# Patient Record
Sex: Female | Born: 1990 | Race: Black or African American | Hispanic: No | Marital: Single | Smoking: Never smoker
Health system: Southern US, Community
[De-identification: ages and names within clinical notes are randomized; demographics above are authoritative.]

## PROBLEM LIST (undated history)

## (undated) DIAGNOSIS — I10 Essential (primary) hypertension: Secondary | ICD-10-CM

## (undated) DIAGNOSIS — Z87440 Personal history of urinary (tract) infections: Secondary | ICD-10-CM

## (undated) DIAGNOSIS — N390 Urinary tract infection, site not specified: Secondary | ICD-10-CM

## (undated) DIAGNOSIS — Z87448 Personal history of other diseases of urinary system: Secondary | ICD-10-CM

## (undated) HISTORY — DX: Personal history of other diseases of urinary system: Z87.448

## (undated) HISTORY — DX: Personal history of urinary (tract) infections: Z87.440

## (undated) HISTORY — DX: Essential (primary) hypertension: I10

## (undated) HISTORY — PX: NO PAST SURGERIES: SHX2092

---
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
## 2009-03-22 ENCOUNTER — Inpatient Hospital Stay (HOSPITAL_COMMUNITY): Admission: AD | Admit: 2009-03-22 | Discharge: 2009-03-25 | Payer: Self-pay | Admitting: Obstetrics and Gynecology

## 2009-05-02 ENCOUNTER — Inpatient Hospital Stay (HOSPITAL_COMMUNITY): Admission: RE | Admit: 2009-05-02 | Discharge: 2009-05-04 | Payer: Self-pay | Admitting: Obstetrics and Gynecology

## 2010-05-23 IMAGING — US US RENAL
1 series · 13 of 25 positions shown · non-contrast
Comparison: 12/08/2008

CLINICAL DATA: Right flank pain.  Fever.  34 weeks estimated
gestational age

RENAL/URINARY TRACT ULTRASOUND COMPLETE

[Series 1: us renal · 13 of 30 slices shown]
[im 1/30]
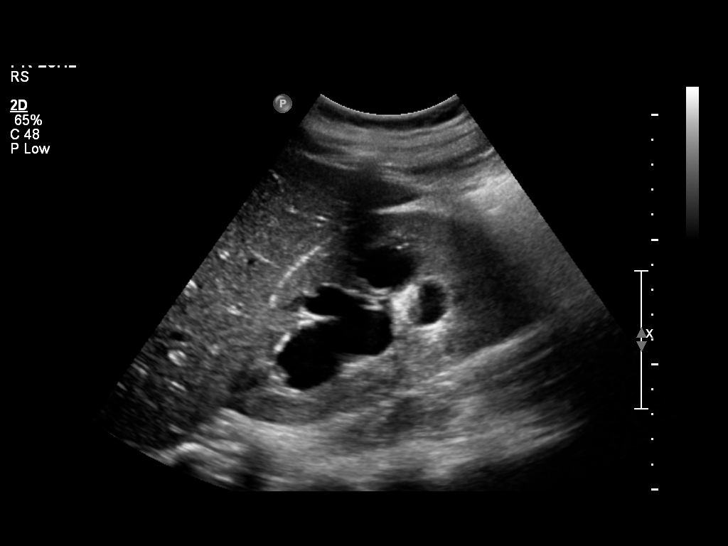
[im 3/30]
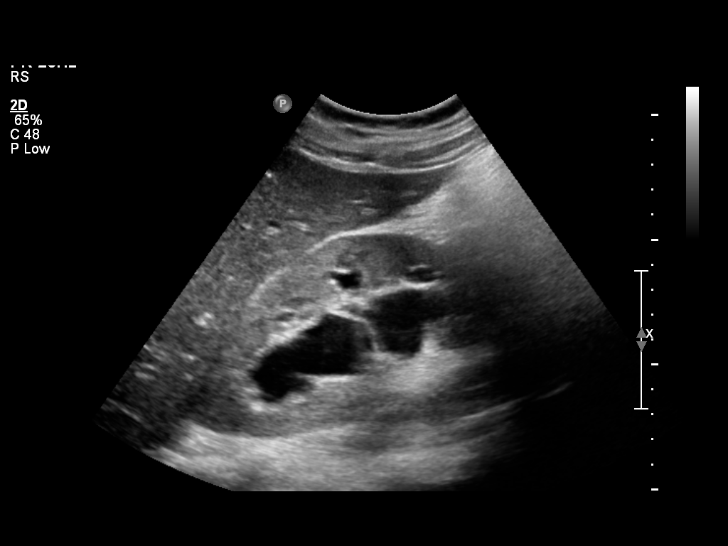
[im 5/30]
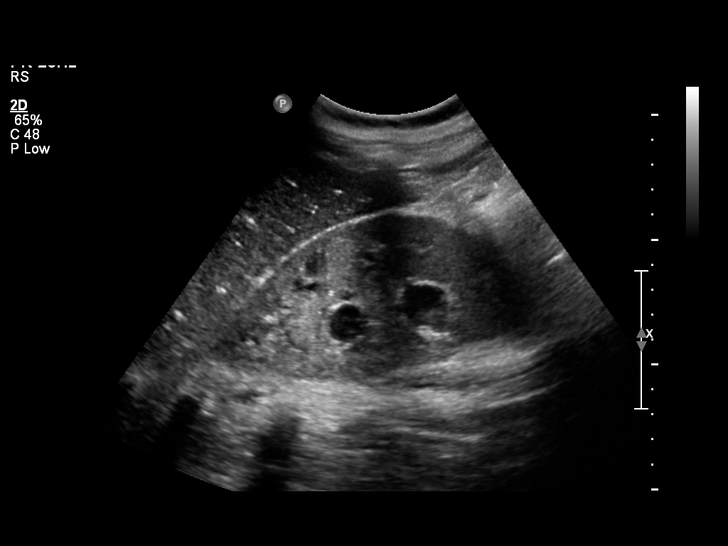
[im 8/30]
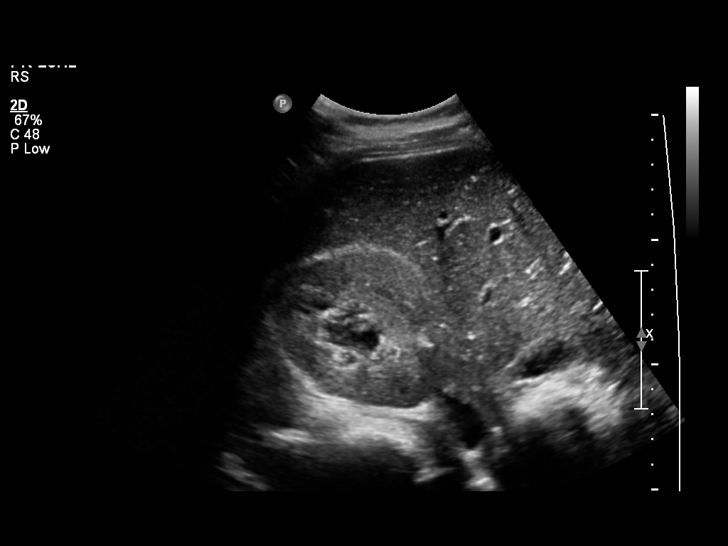
[im 10/30]
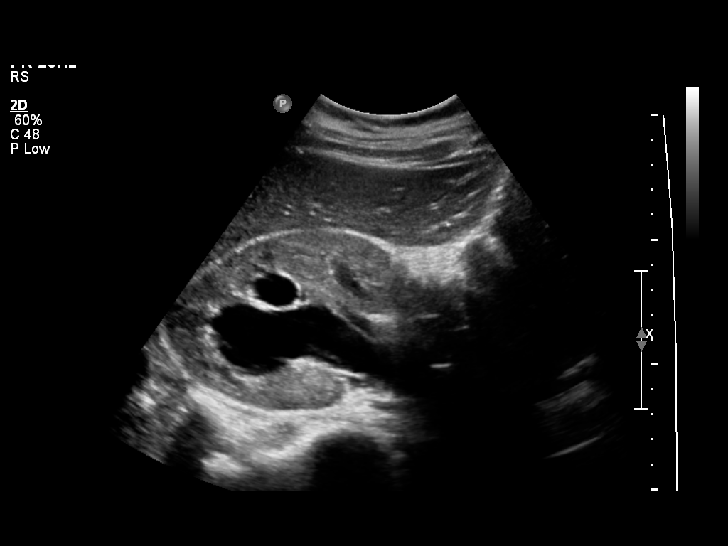
[im 13/30]
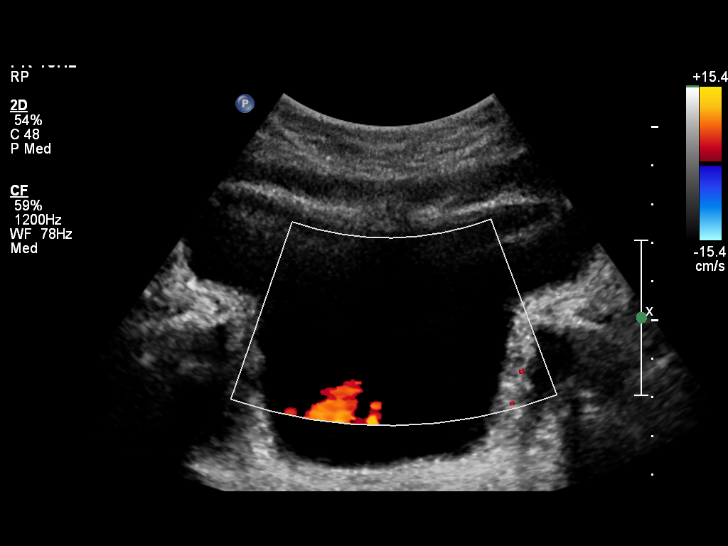
[im 15/30]
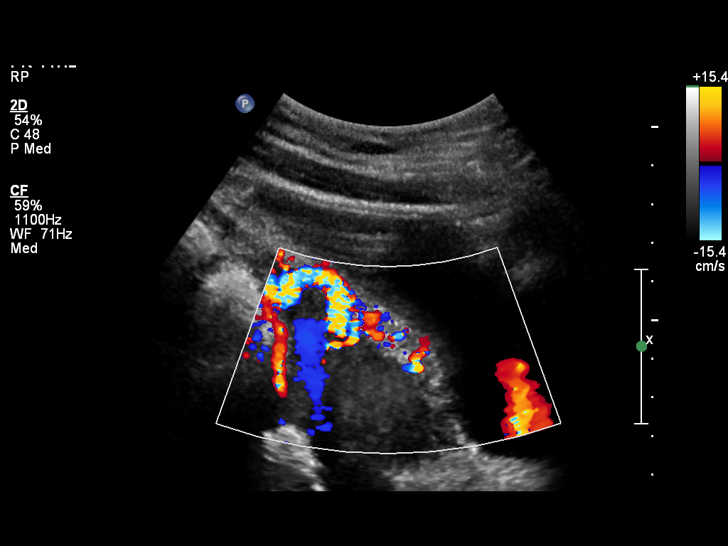
[im 17/30]
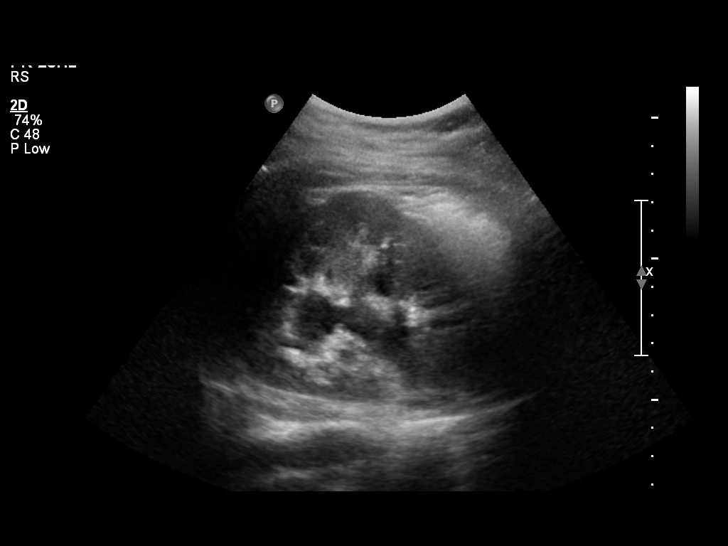
[im 20/30]
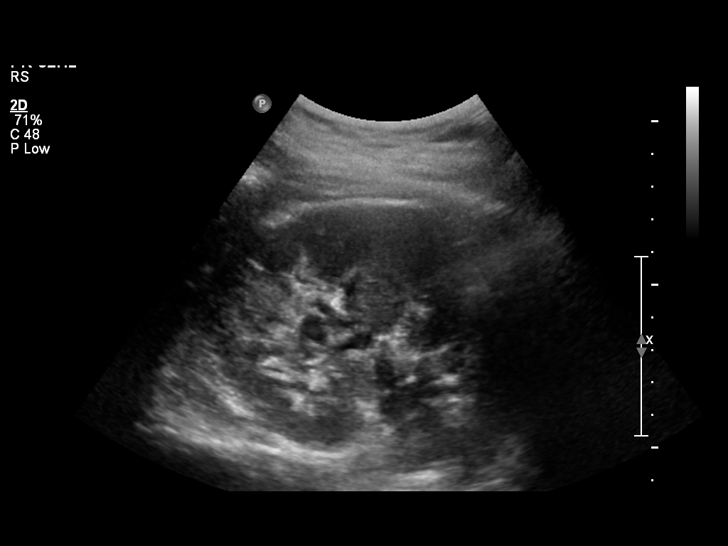
[im 22/30]
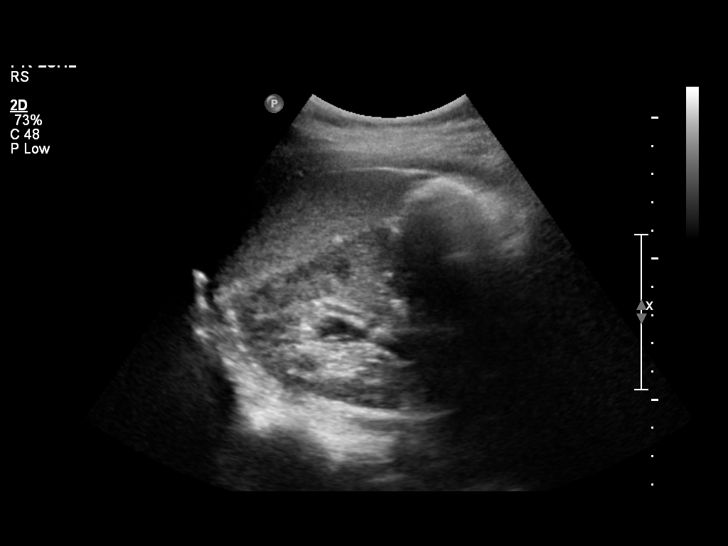
[im 25/30]
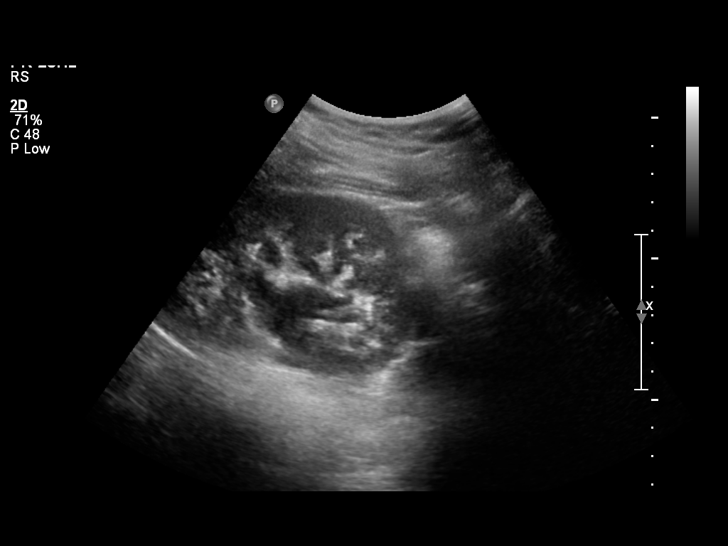
[im 27/30]
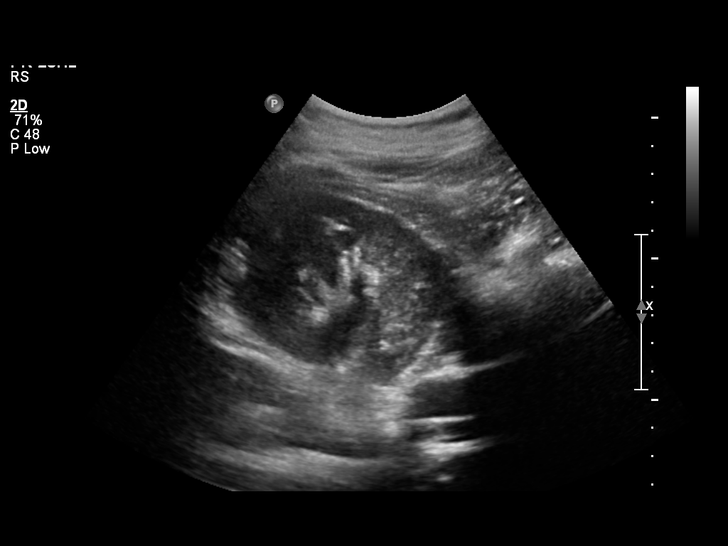
[im 30/30]
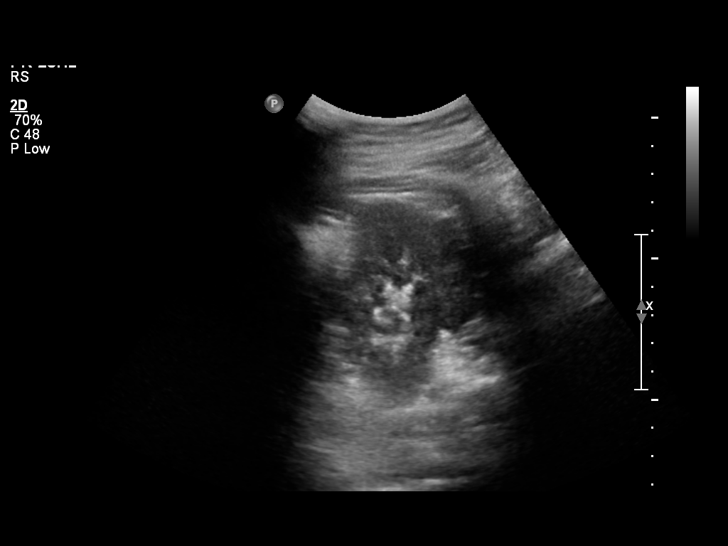

[13 of 25 positions shown; findings below may reference images not displayed]

FINDINGS: Right Kidney:  The right kidney demonstrates a sagittal length of
14.5 cm.  No focal parenchymal abnormalities are seen.  There is
moderate to severe hydronephrosis identified on the right.    This
is slightly prominent for a patient who is currently 34 weeks
estimated gestational age, but a strong right ureteral jet is
present and would mitigate against a complete obstructive process
on this side.

Left Kidney:  The left kidney demonstrates a sagittal length of
10.7 cm.  There is mild hydronephrosis identified on this side
which would correlate with the current estimated gestational age.
No focal parenchymal abnormalities are noted.

Bladder:  The bladder is well distended.  Bilateral ureteral jets
were noted.
IMPRESSION: Moderate to severe hydronephrosis on the right which is slightly
prominent for the current gestational status.  A strong ureteral
jet on this side would mitigate against an obstructive process
although a partial or intermittent obstruction cannot be excluded
with certainty. No focal parenchymal abnormalities noted.

## 2010-12-12 LAB — CBC
MCHC: 32.4 g/dL (ref 30.0–36.0)
Platelets: 120 10*3/uL — ABNORMAL LOW (ref 150–400)
RBC: 3.94 MIL/uL (ref 3.87–5.11)
RDW: 15.3 % (ref 11.5–15.5)
WBC: 16.1 10*3/uL — ABNORMAL HIGH (ref 4.0–10.5)

## 2010-12-12 LAB — RPR: RPR Ser Ql: NONREACTIVE

## 2010-12-13 LAB — COMPREHENSIVE METABOLIC PANEL
ALT: 15 U/L (ref 0–35)
AST: 18 U/L (ref 0–37)
CO2: 21 mEq/L (ref 19–32)
Chloride: 100 mEq/L (ref 96–112)
GFR calc Af Amer: >60 mL/min (ref >60)
GFR calc non Af Amer: >60 mL/min (ref >60)
Sodium: 131 mEq/L — ABNORMAL LOW (ref 135–145)
Total Bilirubin: 0.8 mg/dL (ref 0.3–1.2)

## 2010-12-13 LAB — URINALYSIS, ROUTINE W REFLEX MICROSCOPIC
Glucose, UA: NEGATIVE mg/dL
Protein, ur: 30 mg/dL — AB
Specific Gravity, Urine: <1.005 — ABNORMAL LOW (ref 1.005–1.030)

## 2010-12-13 LAB — CBC
MCV: 83 fL (ref 78.0–100.0)
Platelets: 171 10*3/uL (ref 150–400)
RBC: 3.92 MIL/uL (ref 3.87–5.11)
RDW: 14.1 % (ref 11.5–15.5)
WBC: 17.3 10*3/uL — ABNORMAL HIGH (ref 4.0–10.5)

## 2010-12-13 LAB — DIFFERENTIAL
Basophils Absolute: 0 10*3/uL (ref 0.0–0.1)
Basophils Relative: 0 % (ref 0–1)
Eosinophils Relative: 0 % (ref 0–5)
Monocytes Absolute: 1.3 10*3/uL — ABNORMAL HIGH (ref 0.1–1.0)

## 2010-12-13 LAB — URINE CULTURE

## 2010-12-13 LAB — URINE MICROSCOPIC-ADD ON

## 2010-12-16 LAB — URINE MICROSCOPIC-ADD ON

## 2010-12-16 LAB — WET PREP, GENITAL
Trich, Wet Prep: NONE SEEN
Yeast Wet Prep HPF POC: NONE SEEN

## 2010-12-16 LAB — URINE CULTURE: Colony Count: >=100000

## 2010-12-16 LAB — URINALYSIS, ROUTINE W REFLEX MICROSCOPIC
Bilirubin Urine: NEGATIVE
Nitrite: POSITIVE — AB
Specific Gravity, Urine: 1.01 (ref 1.005–1.030)
Urobilinogen, UA: 1 mg/dL (ref 0.0–1.0)

## 2010-12-16 LAB — DIFFERENTIAL
Basophils Absolute: 0 10*3/uL (ref 0.0–0.1)
Basophils Relative: 0 % (ref 0–1)
Monocytes Relative: 2 % — ABNORMAL LOW (ref 3–12)
Neutro Abs: 10.9 10*3/uL — ABNORMAL HIGH (ref 1.7–7.7)
Neutrophils Relative %: 93 % — ABNORMAL HIGH (ref 43–77)

## 2010-12-16 LAB — POCT I-STAT, CHEM 8
Chloride: 106 mEq/L (ref 96–112)
HCT: 36 % (ref 36.0–46.0)
Hemoglobin: 12.2 g/dL (ref 12.0–15.0)
Potassium: 4 mEq/L (ref 3.5–5.1)
Sodium: 137 mEq/L (ref 135–145)

## 2010-12-16 LAB — CBC
MCHC: 33.5 g/dL (ref 30.0–36.0)
Platelets: 145 10*3/uL — ABNORMAL LOW (ref 150–400)
RBC: 4.06 MIL/uL (ref 3.87–5.11)
RDW: 13.7 % (ref 11.5–15.5)

## 2011-01-19 NOTE — Discharge Summary (Signed)
Jaime Munoz, Jaime Munoz              ACCOUNT NO.:  [id number]   MEDICAL RECORD NO.:  [id number]          PATIENT TYPE:  INP   LOCATION:  9132                          FACILITY:  [facility]   PHYSICIAN:  Malachi Pro. Ambrose Mantle, M.D. DATE OF BIRTH:  [birth year]   DATE OF ADMISSION:  05/02/2009  DATE OF DISCHARGE:  05/04/2009                               DISCHARGE SUMMARY   HISTORY OF PRESENT ILLNESS:  A 20 year old African American single  female para 0, gravida 1, EDC May 09, 2009 by ultrasound at 18  weeks and 3 days and May 05, 2009 by dates of her last period  admitted for induction of labor.  Blood group and type A positive,  negative antibody, nonreactive serology, rubella immune, hepatitis B  surface antigen negative, HIV negative, GC and chlamydia negative, 1-  hour Glucola 89, group B strep negative.  The patient's early prenatal  care was complicated by pyelonephritis with hospital admission.  She was  readmitted on March 22, 2009 for 3 days for the same diagnosis.  Prenatal  care was otherwise uncomplicated.   PAST MEDICAL HISTORY:  No known allergies.  No operations.  Illnesses,  urinary tract infections.   FAMILY HISTORY:  Maternal grandmother with breast cancer.  Maternal  grandfather with prostate cancer.   SOCIAL HISTORY:  Alcohol, tobacco and drugs none.   HOSPITAL COURSE:  On admission, her vital signs were normal.  Heart and  lungs were normal.  The abdomen was soft.  Fundal height was 38 cm.  Fetal heart tones were normal.  There were good accelerations.  No  decelerations.  Cervix was a fingertip, 50% vertex at -2 to -3 on May 01, 2009.  Pitocin was begun by 12:45 p.m.  Pitocin was at 14 milliunits  a minute.  Contractions were possibly every 3 minutes, but not painful.  The cervix was 2 cm, 70-80% vertex at -2.  Artificial rupture of the  membranes produced clear fluid.  The patient then received an epidural.  By 6:17 p.m., the cervix was 6 cm, 90% vertex at -1.   She was then found  to be fully dilated with the head on the perineum.  She delivered  spontaneously OA over an intact perineum with a right labial and left  vaginal laceration.  A living female infant 6 pounds 8 ounces, Apgars of  8 at 1 and 9 at 5 minutes.  Dr. Ambrose Mantle was in attendance.  Placenta was  intact.  Uterus was normal.  Lacerations repaired with 3-0 Vicryl under  local block.  Blood loss about 400 mL.  Postpartum, the patient did  quite well and was discharged on the second postpartum day.  Initial  hemoglobin of 11.8, hematocrit 36.2, white count 12,100, platelet count  120,000.  Followup hemoglobin 10.8, RPR was nonreactive, platelet count  of 119,000.   FINAL DIAGNOSES:  Intrauterine pregnancy 39 weeks, delivered occiput  anterior.   OPERATION:  1. Spontaneous delivery OA.  2. Repair of right labial and left vaginal lacerations.   FINAL CONDITION:  Improved.   DISCHARGE INSTRUCTIONS:  Instructions include our regular discharge  instruction booklet.  The patient is advised to return to the office in  6 weeks for followup examination.  Motrin 600 mg 30 tablets one every 6  hours as needed for pain is given at discharge.      Malachi Pro. Ambrose Mantle, M.D.  Electronically Signed     TFH/MEDQ  D:  05/04/2009  T:  05/04/2009  Job:  [id number]

## 2011-01-19 NOTE — Discharge Summary (Signed)
[name], [name]              ACCOUNT NO.:  [id number]   MEDICAL RECORD NO.:  [id number]          PATIENT TYPE:  INP   LOCATION:  9317                          [facility]ACILITY:  [facility]   PHYSICIAN:  Norton Blizzard, MD    DATE OF BIRTH:  1991-08-28   DATE OF ADMISSION:  12/08/2008  DATE OF DISCHARGE:                               DISCHARGE SUMMARY   DISCHARGE DIAGNOSES:  1. Intrauterine pregnancy at 51 and 1/7th weeks gestational age.  2. Right pyelonephritis.   REASON FOR ADMISSION:  Ms. Jaime Munoz is a 20 year old gravida 1,  para 0, who presented with right flank pain, nausea, vomiting and low-  grade fever to the [facility] Emergency Room.  She was transferred to  [facility] and was admitted after noting a white count of 14 as  well as an urinalysis that was suggestive of urinary tract infection.  She was diagnosed with the right pyelonephritis and admitted for IV  antibiotics.   HOSPITAL COURSE:  The patient was admitted and received 48 hours of  intravenous Rocephin.  She had a renal ultrasound which was essentially  normal and showed bilateral ureteral jets.  OB ultrasound showed an  estimated due date of May 05, 2009, corresponding to the gestational  age at 36 weeks and 1 day at the time of discharge.  There was no  evidence of any fetal anatomic abnormalities; ultrasound risk for Down  syndrome was 1in 7151.  The patient remained afebrile during her  hospitalization.  Her pain improved.  She was tolerating full diet and  will be discharged home on a 2-week course of antibiotics.   DISCHARGE CONDITION:  Good.   DISCHARGE MEDICATIONS:  1. Keflex 500 mg 4 times daily for additional 12 more days.  2. Prenatal vitamins daily.   DISCHARGE INSTRUCTIONS:  The patient is instructed to continue her  antibiotics as prescribed.  Additionally, she was instructed that she  needs to get to the [facility] Department to begin her  prenatal care there.  She was  given a note for confirmation of her  pregnancy.      Odie Sera, DO  Electronically Signed     ______________________________  Norton Blizzard, MD    MC/MEDQ  D:  12/10/2008  T:  12/10/2008  Job:  [id number]

## 2011-01-19 NOTE — Discharge Summary (Signed)
[name], Munoz              ACCOUNT NO.:  [id number]   MEDICAL RECORD NO.:  [id number]          PATIENT TYPE:  INP   LOCATION:  9158                          [facility]ACILITY:  [facility]   PHYSICIAN:  Malachi Pro. Ambrose Mantle, M.D. DATE OF BIRTH:  08/18/1991   DATE OF ADMISSION:  03/22/2009  DATE OF DISCHARGE:  03/25/2009                               DISCHARGE SUMMARY   This is a 20 year old black female, para 0, gravida 1, estimated  gestational age [redacted] weeks, presented with complaints of decreased fetal  movement, some nausea and vomiting with blood in her emesis, fever and  chills.  The patient was seen in the office on March 19, 2009, and  diagnosed with an urinary tract infection and placed on Macrobid.  The  urine culture came back on March 21, 2009, sensitive to [facility], and the  patient was instructed to finish Macrobid and then start p.o. Keflex for  suppression due to pyelo earlier in the pregnancy.  The patient was  taking Macrobid on admission.  Prenatal care at our office since 22  weeks without problems until now.  She was admitted for pyelonephritis  before she became our patient in April 2010.   PAST MEDICAL HISTORY:  History of urinary tract infection,  pyelonephritis.   No surgical history.   No known drug allergies.   MEDICATIONS:  Macrobid.   On admission, her temperature was 101.3, otherwise vital signs were  normal.  Heart normal size and sounds, 2/6 systolic ejection murmur.  Lungs were clear to auscultation.  There was slight tenderness on the  right flank.  The abdomen was gravid and slightly tender suprapubically.  Cervix was a tight fingertip, thick, vertex was high.  Liver function  tests were normal.  Urinalysis showed only 0-2 white cells and 0-2 red  cells, white count was 17,300.  Dr. Jackelyn Knife admitted the patient with  possible pyelonephritis, placed on intravenous Ancef.  The patient  rapidly became better.  She remained somewhat febrile until the evening  of March 23, 2009, has not been febrile since that time and is ready for  discharge.  Urinalysis on admission showed 30 mg percent protein, 0-2  white cell, 0-2 red cells, rare bacteria.  Urine culture is not actually  on the chart at this point but showed 20,000 colonies of E. coli.  Comprehensive metabolic profile was normal.  White count 17,300,  hemoglobin 11, hematocrit 32.5, platelet count 157,000.  Followup white  count was 11,600, hemoglobin 10, hematocrit 30.4.  The urine culture as  stated showed 20,000 colonies per milliliter of E. coli.  Ultrasound  done to try to rule out renal obstruction showed moderate-to-severe  hydronephrosis on the right which was slightly prominent for the current  gestational age with a strong ureteral jet on that side with mitigated  gas and obstructive process, although a partial intermittent obstruction  could not be excluded with certainty.  No abnormalities of the kidneys  were seen.  The right kidney demonstrated a sagittal length of 14.5 cm.  No focal parenchymal abnormality was seen.  The left kidney measured  10.7  cm sagittal length.  There was mild hydronephrosis on that side.   FINAL DIAGNOSES:  Intrauterine pregnancy at 33 weeks, febrile morbidity  thought to be secondary to pyelonephritis, although this was not  absolutely confirmed with an urine culture of 20,000 colonies.   OPERATIONS:  None.   FINAL CONDITION:  Improved.   Instructions include regular diet, Keflex 500 mg p.o. q.6 h. for 10 days  and then 500 mg once a day.  She is to return to the office next week  and call with any problems.  Her nonstress test was reactive.  It was  reactive for 15 beats acceleration lasting 15 seconds twice in 20  minutes, although it was not super-reactive, it did meet criteria.      Malachi Pro. Ambrose Mantle, M.D.  Electronically Signed     TFH/MEDQ  D:  03/25/2009  T:  03/25/2009  Job:  [id number]

## 2011-01-22 NOTE — Discharge Summary (Signed)
Jaime Munoz, Jaime Munoz              ACCOUNT NO.:  [id number]   MEDICAL RECORD NO.:  [id number]          PATIENT TYPE:  INP   LOCATION:  9158                          [facility]ACILITY:  [facility]   PHYSICIAN:  Malachi Pro. Ambrose Mantle, M.D. DATE OF BIRTH:  [birth year]   DATE OF ADMISSION:  03/22/2009  DATE OF DISCHARGE:  03/25/2009                               DISCHARGE SUMMARY   This is a 20 year old black female, para 0, gravida 1, at 39 weeks'  gestation, presented complaining of decreased fetal movement, some  nausea and vomiting with blood in her emesis, fever and chills.  She had  been seen in the office on March 19, 2009, and diagnosed with an urinary  tract infection and placed on Macrobid.  Urine culture came back on July  16 ,2010, sensitive to [facility], and the patient was instructed to  finish Macrobid and then start p.o. Keflex for suppression due to  pyelonephritis earlier in pregnancy.  The patient began her prenatal  care in our office at 22 weeks' gestation.  She had been admitted in  April 2010 for pyelonephritis.   Past medical history of urinary tract infection.   No surgical history.   Taking Macrobid.   No known drug allergies.   On admission, temperature was 101.3.  Otherwise, her vital signs were  normal.  Heart and lungs were normal, 2/6 systolic ejection murmur.  The  abdomen was soft, gravid, slightly tender suprapubically.  Cervix was a  tight fingertip, thick.  Vertex was high.   Liver function tests were normal.  CBC showed a white count of 17,300.  Metabolic profile was normal.  The patient was begun on intravenous  Ancef.  On March 23, 2009, the patient's temperature maximum had been  101.5 at 5 a.m. on March 23, 2009.  On March 24, 2009, her temp max in the  last 24 hours had been 100.9.  She felt much better.  Admitting  urinalysis had only shown 0-2 white cells.  Urine culture showed 20,000  of E. coli.  On March 25, 2009, the patient had been afebrile for 36  hours,  she felt fine, nonstress tests did meet criteria for reactive  test, and she was ready for discharge.   LABORATORY DATA:  A renal ultrasound done to try to rule out ureteral  obstruction showed moderate-to-severe hydronephrosis on the right side  which was slightly prominent for her gestational age, but a strong  ureteral jet was seen on that side which would mitigate against an  obstructive process.  No kidney abnormalities were noted.  Her urine  culture showed 20,000 colonies per mL of E. coli sensitive to cefazolin.  Urinalysis had shown 1+ protein but 0-2 white cell, 0-2 red cells, rare  bacteria.  Metabolic profile was normal except for sodium of 131,  potassium of 3.4.  Initial hemoglobin 11.0, hematocrit 32.5, white count  17,300, platelet count of 157,000.  Followup hemoglobin 10.0, white  count 11,600.  There were 84 segs, 8 lymphs, 8 monos.   FINAL DIAGNOSES:  1. Intrauterine pregnancy at 33 weeks.  2. Urinary tract infection.  OPERATIONS:  None.   DISCHARGE MEDICATIONS:  Stop her Macrobid, take prenatal vitamins and  iron, Tylenol as needed for pain, cephalexin 500 mg by mouth every 6  hours for 10 days and subsequently 1 by mouth every day.  The patient is  to return to the office in 1 week for followup examination.      Malachi Pro. Ambrose Mantle, M.D.  Electronically Signed     TFH/MEDQ  D:  04/08/2009  T:  04/08/2009  Job:  [id number]

## 2011-06-22 ENCOUNTER — Inpatient Hospital Stay (INDEPENDENT_AMBULATORY_CARE_PROVIDER_SITE_OTHER)
Admission: RE | Admit: 2011-06-22 | Discharge: 2011-06-22 | Disposition: A | Discharge status: Home / Self Care | Payer: Self-pay | Source: Ambulatory Visit | Attending: Emergency Medicine | Admitting: Emergency Medicine

## 2011-06-22 DIAGNOSIS — L299 Pruritus, unspecified: Secondary | ICD-10-CM

## 2012-06-23 ENCOUNTER — Encounter (HOSPITAL_COMMUNITY): Payer: Self-pay | Admitting: *Deleted

## 2012-06-23 ENCOUNTER — Emergency Department (INDEPENDENT_AMBULATORY_CARE_PROVIDER_SITE_OTHER)
Admission: EM | Admit: 2012-06-23 | Discharge: 2012-06-23 | Discharge status: Against Medical Advice | Payer: Self-pay | Source: Home / Self Care

## 2012-06-23 DIAGNOSIS — N898 Other specified noninflammatory disorders of vagina: Secondary | ICD-10-CM

## 2012-06-23 HISTORY — DX: Urinary tract infection, site not specified: N39.0

## 2012-06-23 LAB — POCT URINALYSIS DIP (DEVICE)
Hgb urine dipstick: NEGATIVE
Ketones, ur: NEGATIVE mg/dL
Protein, ur: NEGATIVE mg/dL
Specific Gravity, Urine: 1.01 (ref 1.005–1.030)
Urobilinogen, UA: 0.2 mg/dL (ref 0.0–1.0)

## 2012-06-23 NOTE — ED Notes (Signed)
Patient is no longer in room, nor in WR.

## 2012-06-23 NOTE — ED Notes (Signed)
RN noticed that pt's door was open and went in exam room to check on pt and pt was gone.  Pt told no one that she was leaving.  I called her and asked her where she was and she stated "I can't sit there for 1 hour and wait to see a doctor so I will just call my OB/GYN and make and appt with them".

## 2012-06-23 NOTE — ED Notes (Signed)
Reports broken condom during intercourse approx 2 wks ago; unsure if a piece of condom was retained.  Now has whitish discharge with odor.  Denies any pain.  LMP approx 1 mo ago - reports irregular periods.

## 2012-09-08 ENCOUNTER — Encounter (HOSPITAL_COMMUNITY): Payer: Self-pay

## 2012-09-08 ENCOUNTER — Emergency Department (INDEPENDENT_AMBULATORY_CARE_PROVIDER_SITE_OTHER)
Admission: EM | Admit: 2012-09-08 | Discharge: 2012-09-08 | Disposition: A | Discharge status: Home / Self Care | Payer: Self-pay | Source: Home / Self Care | Attending: Emergency Medicine | Admitting: Emergency Medicine

## 2012-09-08 ENCOUNTER — Emergency Department (INDEPENDENT_AMBULATORY_CARE_PROVIDER_SITE_OTHER): Payer: Self-pay

## 2012-09-08 DIAGNOSIS — N912 Amenorrhea, unspecified: Secondary | ICD-10-CM

## 2012-09-08 DIAGNOSIS — S93409A Sprain of unspecified ligament of unspecified ankle, initial encounter: Secondary | ICD-10-CM

## 2012-09-08 MED ORDER — IBUPROFEN 400 MG PO TABS: 400.0000 mg | ORAL_TABLET | Freq: Four times a day (QID) | ORAL | Status: DC | PRN

## 2012-09-08 MED ORDER — IBUPROFEN 600 MG PO TABS: 600.0000 mg | ORAL_TABLET | Freq: Four times a day (QID) | ORAL | Status: DC | PRN

## 2012-09-08 NOTE — ED Provider Notes (Signed)
History     CSN: [id number]  Arrival date & time 09/08/12  1041   First MD Initiated Contact with Patient 09/08/12 1208      Chief Complaint  Patient presents with  . Ankle Pain    (Consider location/radiation/quality/duration/timing/severity/associated sxs/prior treatment) HPI Comments: Patient presents urgent care this afternoon described that yesterday she accidentally twisted her left ankle. Has been having pain with ambulation and movement and swelling on the lateral aspect of her left ankle. Denies any numbness, tingling sensation on left foot. Patient also describes that she has not seen her menses since October. She is sexually active.  Patient is a 22 y.o. female presenting with ankle pain. The history is provided by the patient. A language interpreter was used.  Ankle Pain  The incident occurred yesterday. The incident occurred at home. The pain is present in the left ankle. The quality of the pain is described as aching. The pain is at a severity of 6/10. The pain has been constant since onset. Pertinent negatives include no numbness, no inability to bear weight, no loss of motion, no muscle weakness, no loss of sensation and no tingling.    Past Medical History  Diagnosis Date  . UTI (lower urinary tract infection)     History reviewed. No pertinent past surgical history.  History reviewed. No pertinent family history.  History  Substance Use Topics  . Smoking status: Never Smoker   . Smokeless tobacco: Not on file  . Alcohol Use:     OB History    Grav Para Term Preterm Abortions TAB SAB Ect Mult Living                  Review of Systems  Constitutional: Negative for fever, chills, diaphoresis, appetite change, fatigue and unexpected weight change.  Musculoskeletal: Positive for joint swelling. Negative for myalgias.  Skin: Negative for rash and wound.  Neurological: Negative for tingling and numbness.    Allergies  Review of patient's allergies  indicates no known allergies.  Home Medications  No current outpatient prescriptions on file.  LMP 06/30/2012  Physical Exam  Constitutional: Vital signs are normal. She appears well-developed and well-nourished.  Non-toxic appearance. She does not have a sickly appearance. She does not appear ill. No distress.  Musculoskeletal: She exhibits tenderness.       Left foot: She exhibits decreased range of motion, tenderness, bony tenderness and swelling. She exhibits normal capillary refill, no crepitus, no deformity and no laceration.       Feet:  Neurological: She is alert.  Skin: No rash noted. No erythema.    ED Course  Procedures (including critical care time)   Labs Reviewed  POCT PREGNANCY, URINE   No results found.   No diagnosis found.    MDM  Problem #1 ankle sprain :RICE- Measures- left ankle sprain- stable. Discussed ankle rehabilitation exercises.  Problem #2 Amenorrhea-negative pregnancy test otherwise patient to followup in the GYN Dr. for evaluation of menstrual irregularities        Jimmie Molly, MD 09/08/12 1324

## 2012-09-08 NOTE — ED Notes (Signed)
States she injured left ankle yesterday ; has pain w ambulation . No menses since October  ( no sex since October, last intercourse w protection)

## 2013-04-11 ENCOUNTER — Encounter (HOSPITAL_COMMUNITY): Payer: Self-pay

## 2013-04-11 ENCOUNTER — Emergency Department (HOSPITAL_COMMUNITY)
Admission: EM | Admit: 2013-04-11 | Discharge: 2013-04-11 | Disposition: A | Discharge status: Home / Self Care | Payer: Medicaid Other | Source: Home / Self Care

## 2013-04-11 DIAGNOSIS — J069 Acute upper respiratory infection, unspecified: Secondary | ICD-10-CM

## 2013-04-11 NOTE — ED Notes (Signed)
URI type c/o; See MD assessment

## 2013-04-11 NOTE — ED Provider Notes (Signed)
Jaime Munoz is a 22 y.o. female who presents to Urgent Care today for right ear pain and sore throat present for the last one to 2 weeks. Additionally patient notes some mild nasal congestion and occasional cough. She denies any fevers or chills he notes the pain is mild to moderate. She's tried some ibuprofen and Tylenol which has helped a bit. She denies any chest pains palpitations or trouble breathing. 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    PMH reviewed. Otherwise healthy History  Substance Use Topics  . Smoking status: Never Smoker   . Smokeless tobacco: Not on file  . Alcohol Use:    ROS as above Medications reviewed. No current facility-administered medications for this encounter.   Current Outpatient Prescriptions  Medication Sig Dispense Refill  . ibuprofen (ADVIL,MOTRIN) 600 MG tablet Take 1 tablet (600 mg total) by mouth every 6 (six) hours as needed for pain.  15 tablet  0    Exam:  BP 133/79  Pulse 66  Temp(Src) 98 F (36.7 C) (Oral)  Resp 18  SpO2 98% Gen: Well NAD HEENT: EOMI,  MMM, bilateral tympanic membranes are retracted but otherwise normal appearing. Posterior pharynx is normal appearing Lungs: CTABL Nl WOB Heart: RRR no MRG Abd: NABS, NT, ND Exts: Non edematous BL  LE, warm and well perfused.   No results found for this or any previous visit (from the past 24 hour(s)). No results found.  Assessment and Plan: 22 y.o. female with viral URI.  No evidence of bacterial infection.  Plan to use oral decongestants and nasal decongestion sprays.  Tylenol or ibuprofen for pain.  Followup with primary care provider as needed. Discussed warning signs or symptoms. Please see discharge instructions. Patient expresses understanding.      Rodolph Bong, MD 04/11/13 1028

## 2013-09-10 ENCOUNTER — Emergency Department (HOSPITAL_COMMUNITY)
Admission: EM | Admit: 2013-09-10 | Discharge: 2013-09-10 | Disposition: A | Discharge status: Home / Self Care | Payer: Medicaid Other | Source: Home / Self Care | Attending: Emergency Medicine | Admitting: Emergency Medicine

## 2013-09-10 ENCOUNTER — Encounter (HOSPITAL_COMMUNITY): Payer: Self-pay | Admitting: Emergency Medicine

## 2013-09-10 DIAGNOSIS — J019 Acute sinusitis, unspecified: Secondary | ICD-10-CM

## 2013-09-10 LAB — POCT RAPID STREP A: STREPTOCOCCUS, GROUP A SCREEN (DIRECT): NEGATIVE

## 2013-09-10 MED ORDER — FLUTICASONE PROPIONATE 50 MCG/ACT NA SUSP: 2.0000 | Freq: Every day | NASAL | Status: DC

## 2013-09-10 MED ORDER — AMOXICILLIN-POT CLAVULANATE 875-125 MG PO TABS: 1.0000 | ORAL_TABLET | Freq: Two times a day (BID) | ORAL | Status: DC

## 2013-09-10 NOTE — Discharge Instructions (Signed)
Most upper respiratory infections are caused by viruses and do not require antibiotics.  We try to save the antibiotics for when we really need them to avoid resistance.  This does not mean that there is nothing that can be done.  Here are a few hints about things that can be done at home to get over an upper respiratory infection quicker: ° °Get extra sleep and extra fluids.  Get 7 to 9 hours of sleep per night and 6 to 8 glasses of water a day.  Getting extra sleep keeps the immune system from getting run down.  Most people with an upper respiratory infection are a little dehydrated.  The extra fluids also keep the secretions liquified and easier to deal with.  Also, get extra vitamin C.  4000 mg per day is the recommended dose. °For the aches, headache, and fever, acetaminophen or ibuprofen are helpful.  These can be alternated every 4 hours.  People with liver disease should avoid large amounts of acetaminophen, and people with ulcer disease, gastroesophageal reflux, gastritis, congestive heart failure, chronic kidney disease, coronary artery disease and the elderly should avoid ibuprofen. °For nasal congestion try Mucinex-D, or if you're having lots of sneezing or copious clear nasal drainage use Allegra-D, Claritin-D, or Zyrtec-D. People with high blood pressure can take these if their blood pressure is controlled, if not, it's best to avoid the forms with a "D" (decongestants).  You can use the plain Mucinex, Allegra, Claritin, or Zyrtec even if your blood pressure is not controlled.   °A Saline nasal spray such as Ocean Spray can also help as can decongestant sprays such as Afrin, but you should not use the decongestant sprays for more than 3 or 4 days since they can be habituating.  If nasal dryness is a problem, Ayr Nasal Gel can help moisturize your nasal passages.  Breath Rite nasal strips can also offer a non-drug alternative treatment to nasal congestion, especially at night. °For people with symptoms  of sinusitis, sleeping with your head elevated can be helpful.  For sinus pain, moist, hot compresses to the face may provide some relief.  Many people find that inhaling steam as in a shower or from a pot of steaming water can help. °For sore throat, zinc containing lozenges such as Cold-Eze or Zicam are helpful.  Zinc helps to fight infection and has a mild astringent effect that relieves the sore, achey throat.  Hot salt water gargles (8 oz of hot water, 1/2 tsp of table salt, and a pinch of baking soda) can give relief as well as hot beverages such as hot tea. °For the cough, old time remedies such as honey or honey and lemon are tried and true.  Over the counter cough syrups such as Delsym 2 tsp every 12 hours can help as well.  It has also been found recently that Aleve can help control a cough.  The dose is 1 to 2 tablets twice daily with food.  This can be combined with Delsym. (Note, if you are taking ibuprofen, you should not take Aleve as well--take one or the other.) °A cool mist vaporizer will help keep your mucous membranes from drying out.  ° °It's important when you have an upper respiratory infection not to pass the infection to others.  This involves being very careful about the following: ° °Frequent hand washing or use of hand sanitizer, especially after coughing, sneezing, blowing your nose or touching your face, nose or eyes. °Do not   shake hands or touch anyone and try to avoid touching surfaces that other people use such as doorknobs, shopping carts, telephones and computer keyboards. °Use tissues and dispose of them properly in a garbage can or ziplock bag. °Cough into your sleeve. °Do not let others eat or drink after you. ° °It's also important to recognize the signs of serious illness and get evaluated if they occur: °Any respiratory infection that lasts more than 7 to 10 days.  Yellow nasal drainage and sputum are not reliable indicators of a bacterial infection, but if they last for more  than 1 week, see your doctor. °Fever and sore throat can indicate strep. °Fever and cough can indicate influenza or pneumonia. °Any kind of severe symptom such as difficulty breathing, intractable vomiting, or severe pain should prompt you to see a doctor as soon as possible. ° ° °Your body's immune system is really the thing that will get rid of this infection.  Your immune system is comprised of 2 types of specialized cells called T cells and B cells.  T cells coordinate the array of cells in your body that engulf invading bacteria or viruses while B cells orchestrate the production of antibodies that neutralize infection.  Anything we do or any medications we give you, will just strengthen your immune system or help it clear up the infection quicker.  Here are a few helpful hints to improve your immune system to help overcome this illness or to prevent future infections: °· A few vitamins can improve the health of your immune system.  That's why your diet should include plenty of fruits, vegetables, fish, nuts, and whole grains. °· Vitamin A and bet-carotene can increase the cells that fight infections (T cells and B cells).  Vitamin A is abundant in dark greens and orange vegetables such as spinach, greens, sweet potatoes, and carrots. °· Vitamin B6 contributes to the maturation of white blood cells, the cells that fight disease.  Foods with vitamin B6 include cold cereal and bananas. °· Vitamin C is credited with preventing colds because it increases white blood cells and also prevents cellular damage.  Citrus fruits, peaches and green and red bell peppers are all hight in vitamin C. °· Vitamin E is an anti-oxidant that encourages the production of natural killer cells which reject foreign invaders and B cells that produce antibodies.  Foods high in vitamin E include wheat germ, nuts and seeds. °· Foods high in omega-3 fatty acids found in foods like salmon, tuna and mackerel boost your immune system and help  cells to engulf and absorb germs. °· Probiotics are good bacteria that increase your T cells.  These can be found in yogurt and are available in supplements such as Culturelle or Align. °· Moderate exercise increases the strength of your immune system and your ability to recover from illness.  I suggest 3 to 5 moderate intensity 30 minute workouts per week.   °· Sleep is another component of maintaining a strong immune system.  It enables your body to recuperate from the day's activities, stress and work.  My recommendation is to get between 7 and 9 hours of sleep per night. °· If you smoke, try to quit completely or at least cut down.  Drink alcohol only in moderation if at all.  No more than 2 drinks daily for men or 1 for women. °· Get a flu vaccine early in the fall or if you have not gotten one yet, once this illness has run   its course.  If you are over 65, a smoker, or an asthmatic, get a pneumococcal vaccine. °· My final recommendation is to maintain a healthy weight.  Excess weight can impair the immune system by interfering with the way the immune system deals with invading viruses or bacteria. ° ° °

## 2013-09-10 NOTE — ED Provider Notes (Signed)
  Chief Complaint   Chief Complaint  Patient presents with  . URI    History of Present Illness   Jaime Munoz is a 23 year old female who has had a two-week history of sore throat, nasal congestion with green drainage, headache, sinus pressure, ear congestion, and cough productive of green sputum. She denies any fever, chills, wheezing, chest pain, or GI symptoms.  Review of Systems   Other than as noted above, the patient denies any of the following symptoms: Systemic:  No fevers, chills, sweats, or myalgias. Eye:  No redness or discharge. ENT:  No ear pain, headache, nasal congestion, drainage, sinus pressure, or sore throat. Neck:  No neck pain, stiffness, or swollen glands. Lungs:  No cough, sputum production, hemoptysis, wheezing, chest tightness, shortness of breath or chest pain. GI:  No abdominal pain, nausea, vomiting or diarrhea.  PMFSH   Past medical history, family history, social history, meds, and allergies were reviewed.  Physical exam   Vital signs:  BP 109/68  Pulse 72  Temp(Src) 98.8 F (37.1 C) (Oral)  Resp 18  SpO2 98% General:  Alert and oriented.  In no distress.  Skin warm and dry. Eye:  No conjunctival injection or drainage. Lids were normal. ENT:  TMs and canals were normal, without erythema or inflammation.  Nasal mucosa was clear and uncongested, without drainage.  Mucous membranes were moist.  Pharynx was clear with no exudate or drainage.  There were no oral ulcerations or lesions. Neck:  Supple, no adenopathy, tenderness or mass. Lungs:  No respiratory distress.  Lungs were clear to auscultation, without wheezes, rales or rhonchi.  Breath sounds were clear and equal bilaterally.  Heart:  Regular rhythm, without gallops, murmers or rubs. Skin:  Clear, warm, and dry, without rash or lesions.  Labs   Results for orders placed during the hospital encounter of 09/10/13  POCT RAPID STREP A ([facility] ONLY)      Result Value Range   Streptococcus, Group A Screen (Direct) NEGATIVE  NEGATIVE    Assessment     The encounter diagnosis was Acute sinusitis.  Plan    1.  Meds:  The following meds were prescribed:   Discharge Medication List as of 09/10/2013  9:38 AM    START taking these medications   Details  amoxicillin-clavulanate (AUGMENTIN) 875-125 MG per tablet Take 1 tablet by mouth 2 (two) times daily., Starting 09/10/2013, Until Discontinued, Normal    fluticasone (FLONASE) 50 MCG/ACT nasal spray Place 2 sprays into both nostrils daily., Starting 09/10/2013, Until Discontinued, Normal        2.  Patient Education/Counseling:  The patient was given appropriate handouts, self care instructions, and instructed in symptomatic relief.  Instructed to get extra fluids, rest, and use a cool mist vaporizer.   3.  Follow up:  The patient was told to follow up here if no better in 3 to 4 days, or sooner if becoming worse in any way, and given some red flag symptoms such as increasing fever, difficulty breathing, chest pain, or persistent vomiting which would prompt immediate return.  Follow up here as needed.      Reuben Likesavid C Winferd Wease, MD 09/10/13 [phone]

## 2013-09-10 NOTE — ED Notes (Signed)
C/o cold sx for two weeks States first started off as a sore throat and stuffy nose States sx now are cough, runny nose and sore throat No medication taken.

## 2013-09-12 LAB — CULTURE, GROUP A STREP

## 2013-09-30 ENCOUNTER — Emergency Department (INDEPENDENT_AMBULATORY_CARE_PROVIDER_SITE_OTHER)
Admission: EM | Admit: 2013-09-30 | Discharge: 2013-09-30 | Disposition: A | Discharge status: Home / Self Care | Payer: Medicaid Other | Source: Home / Self Care | Attending: Family Medicine | Admitting: Family Medicine

## 2013-09-30 ENCOUNTER — Encounter (HOSPITAL_COMMUNITY): Payer: Self-pay | Admitting: Emergency Medicine

## 2013-09-30 DIAGNOSIS — J069 Acute upper respiratory infection, unspecified: Secondary | ICD-10-CM

## 2013-09-30 LAB — POCT RAPID STREP A: STREPTOCOCCUS, GROUP A SCREEN (DIRECT): NEGATIVE

## 2013-09-30 MED ORDER — IPRATROPIUM BROMIDE 0.06 % NA SOLN: 2.0000 | Freq: Four times a day (QID) | NASAL | Status: DC

## 2013-09-30 MED ORDER — AZITHROMYCIN 250 MG PO TABS: ORAL_TABLET | ORAL | Status: DC

## 2013-09-30 NOTE — Discharge Instructions (Signed)
Drink plenty of fluids as discussed, use medicine as prescribed, and mucinex or delsym for cough. Return or see your doctor if further problems °

## 2013-09-30 NOTE — ED Notes (Signed)
Pt  Reports  Symptoms  Of  sorethroat        With  r  Earache  As  Well  With  The  Onset of  Symptoms      X  1  Week         -  She  Is  Sitting  Upright on  Exam table  Speaking in  Complete  sentances  And  Is  In no acute  Distress

## 2013-09-30 NOTE — ED Provider Notes (Signed)
CSN: [id number]     Arrival date & time 09/30/13  0901 History   First MD Initiated Contact with Patient 09/30/13 [phone]     Chief Complaint  Patient presents with  . Sore Throat   (Consider location/radiation/quality/duration/timing/severity/associated sxs/prior Treatment) Patient is a 23 y.o. female presenting with pharyngitis. The history is provided by the patient.  Sore Throat This is a recurrent problem. The current episode started more than 1 week ago. The problem has not changed since onset.Associated symptoms comments: Seen 1/5 for same, not improved., took all of medicine given..    Past Medical History  Diagnosis Date  . UTI (lower urinary tract infection)    History reviewed. No pertinent past surgical history. History reviewed. No pertinent family history. History  Substance Use Topics  . Smoking status: Never Smoker   . Smokeless tobacco: Not on file  . Alcohol Use:    OB History   Grav Para Term Preterm Abortions TAB SAB Ect Mult Living                 Review of Systems  Constitutional: Negative.   HENT: Positive for congestion, postnasal drip and rhinorrhea.   Respiratory: Negative.     Allergies  Review of patient's allergies indicates no known allergies.  Home Medications   Current Outpatient Rx  Name  Route  Sig  Dispense  Refill  . amoxicillin-clavulanate (AUGMENTIN) 875-125 MG per tablet   Oral   Take 1 tablet by mouth 2 (two) times daily.   20 tablet   0   . azithromycin (ZITHROMAX Z-PAK) 250 MG tablet      Take as directed on pack   6 each   0   . fluticasone (FLONASE) 50 MCG/ACT nasal spray   Each Nare   Place 2 sprays into both nostrils daily.   16 g   0   . ibuprofen (ADVIL,MOTRIN) 600 MG tablet   Oral   Take 1 tablet (600 mg total) by mouth every 6 (six) hours as needed for pain.   15 tablet   0   . ipratropium (ATROVENT) 0.06 % nasal spray   Nasal   Place 2 sprays into the nose 4 (four) times daily.   15 mL   1    BP  126/72  Pulse 77  Temp(Src) 99.2 F (37.3 C) (Oral)  Resp 18  SpO2 100% Physical Exam  Nursing note and vitals reviewed. Constitutional: She is oriented to person, place, and time. She appears well-developed and well-nourished. No distress.  HENT:  Head: Normocephalic.  Right Ear: External ear normal.  Left Ear: External ear normal.  Nose: Mucosal edema and rhinorrhea present.  Mouth/Throat: Posterior oropharyngeal erythema present.  Eyes: Conjunctivae are normal. Pupils are equal, round, and reactive to light.  Neck: Normal range of motion. Neck supple.  Cardiovascular: Regular rhythm and normal heart sounds.   Pulmonary/Chest: Breath sounds normal.  Neurological: She is alert and oriented to person, place, and time.  Skin: Skin is warm and dry.    ED Course  Procedures (including critical care time) Labs Review Labs Reviewed  POCT RAPID STREP A ([facility] ONLY)   Imaging Review No results found.  EKG Interpretation    Date/Time:    Ventricular Rate:    PR Interval:    QRS Duration:   QT Interval:    QTC Calculation:   R Axis:     Text Interpretation:  MDM      Linna HoffJames D Kindl, MD 09/30/13 [phone]

## 2013-10-02 LAB — CULTURE, GROUP A STREP

## 2014-02-06 ENCOUNTER — Encounter (HOSPITAL_COMMUNITY): Payer: Self-pay | Admitting: Emergency Medicine

## 2014-02-06 ENCOUNTER — Emergency Department (HOSPITAL_COMMUNITY)
Admission: EM | Admit: 2014-02-06 | Discharge: 2014-02-06 | Disposition: A | Discharge status: Home / Self Care | Payer: Medicaid Other | Source: Home / Self Care

## 2014-02-06 DIAGNOSIS — M7520 Bicipital tendinitis, unspecified shoulder: Secondary | ICD-10-CM

## 2014-02-06 DIAGNOSIS — M7521 Bicipital tendinitis, right shoulder: Secondary | ICD-10-CM

## 2014-02-06 LAB — HIV ANTIBODY (ROUTINE TESTING W REFLEX): HIV: NONREACTIVE

## 2014-02-06 MED ORDER — METHYLPREDNISOLONE ACETATE 40 MG/ML IJ SUSP
INTRAMUSCULAR | Status: AC
  Filled 2014-02-06: qty 5

## 2014-02-06 MED ORDER — HYDROCODONE-ACETAMINOPHEN 5-325 MG PO TABS: ORAL_TABLET | ORAL | Status: DC

## 2014-02-06 NOTE — ED Notes (Signed)
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

## 2014-02-06 NOTE — Discharge Instructions (Signed)

## 2014-02-06 NOTE — ED Provider Notes (Signed)
Chief Complaint   Chief Complaint  Patient presents with  . 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 She's noticed some aching in her right arm off and on since she started driving the bus this past January. This past Sunday she was at church and lifted up both arms. She felt sudden pain in her right anterior shoulder which radiates into her neck and into her biceps area. She has a limited range of motion. There is no radiation down below the elbow, numbness, tingling, or muscle weakness. No swelling.  Review of Systems   Other than as noted above, the patient denies any of the following symptoms: Systemic:  No fevers or chills. Musculoskeletal:  No joint pain, arthritis, swelling, back pain, or neck pain. No history of arthritis.  Neurological:  No muscular weakness or paresthesia.  PMFSH   Past medical history, family history, social history, meds, and allergies were reviewed.    Physical Examination     Vital signs:  BP 132/74  Pulse 72  Temp(Src) 98.6 F (37 C) (Oral)  Resp 18  SpO2 100% Gen:  Alert and oriented times 3.  In no distress. Musculoskeletal: There is pain to palpation of the anterior shoulder, localized over the biceps tendon. The shoulder has an extremely limited range of motion with only about 45 of flexion and abduction. Yergason's test was negative. Unable to perform Neer test, Hawkins test, or empty cans test. Otherwise, all joints had a full a ROM with no swelling, bruising or deformity.  No edema, pulses full. Extremities were warm and pink.  Capillary refill was brisk.  Skin:  Clear, warm and dry.  No rash. Neuro:  Alert and oriented times 3.  Muscle strength was normal.  Sensation was intact to light touch.   Procedure Note:  Verbal informed consent was obtained from the patient.  Risks and benefits were outlined with the patient.  Patient understands and accepts these risks. A time  out was called and the procedure and identity of the patient were confirmed verbally.    The procedure was then performed as follows:  The anterior aspect of the shoulder was prepped with Betadine and alcohol and anesthetized with ethyl chloride spray. Using a one half inch 27-gauge needle, 1 cc of Depo-Medrol 40 mg strength and 2 cc of 2% Xylocaine were injected around the bicipital tendon and the patient tolerated this procedure well. A Band-Aid dressing was applied and she was given aftercare instructions.  The patient tolerated the procedure well without any immediate complications.     Assessment   The encounter diagnosis was Bicipital tendonitis of right shoulder.  Plan     1.  Meds:  The following meds were prescribed:   New Prescriptions   HYDROCODONE-ACETAMINOPHEN (NORCO/VICODIN) 5-325 MG PER TABLET    1 to 2 tabs every 4 to 6 hours as needed for pain.    2.  Patient Education/Counseling:  The patient was given appropriate handouts, self care instructions, and instructed in symptomatic relief.  She was instructed to rest her shoulder and use ice for the first 3 days, then begin gradual shoulder immobilization. I think she will need physical therapy to get the shoulder completely remobilize, I suggested followup with orthopedics.  3.  Follow up:  The patient was told to follow up here if no better in 3 to 4 days, or sooner if becoming worse in any way, and  given some red flag symptoms such as worsening pain or new neurological symptoms which would prompt immediate return.  Follow up with Dr. Jodi GeraldsJohn Graves in one to 2 weeks.     Reuben Likesavid C Vanity Larsson, MD 02/06/14 1017

## 2016-01-16 ENCOUNTER — Encounter (HOSPITAL_COMMUNITY): Payer: Self-pay | Admitting: *Deleted

## 2016-01-16 ENCOUNTER — Ambulatory Visit (HOSPITAL_COMMUNITY)
Admission: EM | Admit: 2016-01-16 | Discharge: 2016-01-16 | Disposition: A | Discharge status: Home / Self Care | Payer: BLUE CROSS/BLUE SHIELD | Attending: Family Medicine | Admitting: Family Medicine

## 2016-01-16 DIAGNOSIS — T148 Other injury of unspecified body region: Secondary | ICD-10-CM | POA: Diagnosis not present

## 2016-01-16 DIAGNOSIS — W57XXXA Bitten or stung by nonvenomous insect and other nonvenomous arthropods, initial encounter: Secondary | ICD-10-CM

## 2016-01-16 MED ORDER — FLUTICASONE PROPIONATE 0.05 % EX CREA: TOPICAL_CREAM | Freq: Two times a day (BID) | CUTANEOUS | Status: DC

## 2016-01-16 NOTE — Discharge Instructions (Signed)
Wash with sponge provided and then apply cream.

## 2016-01-16 NOTE — ED Provider Notes (Signed)
CSN: [id number]     Arrival date & time 01/16/16  1701 History   First MD Initiated Contact with Patient 01/16/16 1735     Chief Complaint  Patient presents with  . Rash   (Consider location/radiation/quality/duration/timing/severity/associated sxs/prior Treatment) Patient is a 25 y.o. female presenting with rash. The history is provided by the patient.  Rash Location:  Torso Torso rash location:  Upper back and R chest Quality: blistering, dryness and itchiness   Quality: not painful and not red   Severity:  Mild Onset quality:  Sudden Duration:  3 days Progression:  Unchanged Chronicity:  New Context: insect bite/sting   Context comment:  Daughter with similar rash. Relieved by:  None tried Worsened by:  Nothing tried   Past Medical History  Diagnosis Date  . UTI (lower urinary tract infection)    No past surgical history on file. No family history on file. Social History  Substance Use Topics  . Smoking status: Never Smoker   . Smokeless tobacco: Not on file  . Alcohol Use: Not on file   OB History    No data available     Review of Systems  Constitutional: Negative.   Cardiovascular: Negative.   Gastrointestinal: Negative.   Skin: Positive for rash.  All other systems reviewed and are negative.   Allergies  Review of patient's allergies indicates no known allergies.  Home Medications   Prior to Admission medications   Medication Sig Start Date End Date Taking? Authorizing Provider  amoxicillin-clavulanate (AUGMENTIN) 875-125 MG per tablet Take 1 tablet by mouth 2 (two) times daily. 09/10/13   Reuben Likes, MD  azithromycin (ZITHROMAX Z-PAK) 250 MG tablet Take as directed on pack 09/30/13   Linna Hoff, MD  fluticasone (CUTIVATE) 0.05 % cream Apply topically 2 (two) times daily. 01/16/16   Linna Hoff, MD  fluticasone (FLONASE) 50 MCG/ACT nasal spray Place 2 sprays into both nostrils daily. 09/10/13   Reuben Likes, MD  HYDROcodone-acetaminophen  (NORCO/VICODIN) 5-325 MG per tablet 1 to 2 tabs every 4 to 6 hours as needed for pain. 02/06/14   Reuben Likes, MD  ibuprofen (ADVIL,MOTRIN) 600 MG tablet Take 1 tablet (600 mg total) by mouth every 6 (six) hours as needed for pain. 09/08/12   Jimmie Molly, MD  ipratropium (ATROVENT) 0.06 % nasal spray Place 2 sprays into the nose 4 (four) times daily. 09/30/13   Linna Hoff, MD   Meds Ordered and Administered this Visit  Medications - No data to display  BP 125/93 mmHg  Pulse 70  Temp(Src) 100 F (37.8 C) (Oral)  Resp 16  SpO2 100% No data found.   Physical Exam  Constitutional: She is oriented to person, place, and time. She appears well-developed and well-nourished. No distress.  Neurological: She is alert and oriented to person, place, and time.  Skin: Skin is warm and dry. Rash noted.  approx 20 sm vesicular papules in a group with sm crusts, no infection, nontender, located midline and scattered on lat chest.  Nursing note and vitals reviewed.   ED Course  Procedures (including critical care time)  Labs Review Labs Reviewed - No data to display  Imaging Review No results found.   Visual Acuity Review  Right Eye Distance:   Left Eye Distance:   Bilateral Distance:    Right Eye Near:   Left Eye Near:    Bilateral Near:         MDM   1. Multiple  insect bites        Linna HoffJames D Kindl, MD 01/16/16 1754

## 2016-01-16 NOTE — ED Notes (Signed)
Pt  Has  Symptoms  Of  A  Rash  On  Upper back    X  3  Days     Itches    no  New  meds   No  Known  Causative  Agents      No  angioedema

## 2017-02-26 ENCOUNTER — Encounter (HOSPITAL_COMMUNITY): Payer: Self-pay | Admitting: *Deleted

## 2017-02-26 ENCOUNTER — Ambulatory Visit (HOSPITAL_COMMUNITY)
Admission: EM | Admit: 2017-02-26 | Discharge: 2017-02-26 | Disposition: A | Discharge status: Home / Self Care | Payer: BLUE CROSS/BLUE SHIELD

## 2017-02-26 DIAGNOSIS — S90851A Superficial foreign body, right foot, initial encounter: Secondary | ICD-10-CM

## 2017-02-26 DIAGNOSIS — Z23 Encounter for immunization: Secondary | ICD-10-CM | POA: Diagnosis not present

## 2017-02-26 DIAGNOSIS — S8991XA Unspecified injury of right lower leg, initial encounter: Secondary | ICD-10-CM | POA: Diagnosis not present

## 2017-02-26 MED ORDER — TETANUS-DIPHTH-ACELL PERTUSSIS 5-2.5-18.5 LF-MCG/0.5 IM SUSP
INTRAMUSCULAR | Status: AC
  Filled 2017-02-26: qty 0.5

## 2017-02-26 MED ORDER — TETANUS-DIPHTH-ACELL PERTUSSIS 5-2.5-18.5 LF-MCG/0.5 IM SUSP
0.5000 mL | Freq: Once | INTRAMUSCULAR | Status: AC
  Administered 2017-02-26: 0.5 mL via INTRAMUSCULAR

## 2017-02-26 MED ORDER — CEPHALEXIN 500 MG PO CAPS: 500.0000 mg | ORAL_CAPSULE | Freq: Four times a day (QID) | ORAL | 0 refills | Status: DC

## 2017-02-26 NOTE — Discharge Instructions (Signed)
Take tylenol or ibuprofen for pain as directed PRN

## 2017-02-26 NOTE — ED Provider Notes (Signed)
CSN: [id number]     Arrival date & time 02/26/17  1909 History   None    Chief Complaint  Patient presents with  . Foreign Body   (Consider location/radiation/quality/duration/timing/severity/associated sxs/prior Treatment) 26 yo female comes in with fish hook stuck on her right lateral foot. She and her husband were going fishing, and the hook dropped on her feet, didn't notice it until she was about to fish. Denies numbness tingling, redness, increased warmth. Has not taken anything for pain.  Patient realized that she did not get Tdap 1.5 years ago, but TB testing. Does not know last Tdap       Past Medical History:  Diagnosis Date  . UTI (lower urinary tract infection)    History reviewed. No pertinent surgical history. No family history on file. Social History  Substance Use Topics  . Smoking status: Never Smoker  . Smokeless tobacco: Not on file  . Alcohol use Yes     Comment: occasional   OB History    No data available     Review of Systems  Constitutional: Negative for chills, diaphoresis and fever.  Musculoskeletal: Negative for arthralgias, joint swelling and myalgias.  Skin: Positive for wound. Negative for rash.    Allergies  Patient has no known allergies.  Home Medications   Prior to Admission medications   Medication Sig Start Date End Date Taking? Authorizing Provider  DOXYCYCLINE PO Take by mouth.   Yes [provider]  Etonogestrel (NEXPLANON [place]) Inject into the skin.   Yes [provider]  amoxicillin-clavulanate (AUGMENTIN) 875-125 MG per tablet Take 1 tablet by mouth 2 (two) times daily. 09/10/13   Reuben LikesKeller, David C, MD  azithromycin (ZITHROMAX Z-PAK) 250 MG tablet Take as directed on pack 09/30/13   Linna HoffKindl, James D, MD  cephALEXin (KEFLEX) 500 MG capsule Take 1 capsule (500 mg total) by mouth 4 (four) times daily. 02/26/17   Cathie HoopsYu, Jonesha Tsuchiya V, PA-C  fluticasone (CUTIVATE) 0.05 % cream Apply topically 2 (two) times daily. 01/16/16   Linna HoffKindl,  James D, MD  fluticasone (FLONASE) 50 MCG/ACT nasal spray Place 2 sprays into both nostrils daily. 09/10/13   Reuben LikesKeller, David C, MD  HYDROcodone-acetaminophen (NORCO/VICODIN) 5-325 MG per tablet 1 to 2 tabs every 4 to 6 hours as needed for pain. 02/06/14   Reuben LikesKeller, David C, MD  ibuprofen (ADVIL,MOTRIN) 600 MG tablet Take 1 tablet (600 mg total) by mouth every 6 (six) hours as needed for pain. 09/08/12   Jimmie Mollyoll, Paolo, MD  ipratropium (ATROVENT) 0.06 % nasal spray Place 2 sprays into the nose 4 (four) times daily. 09/30/13   Linna HoffKindl, James D, MD   Meds Ordered and Administered this Visit   Medications  Tdap (BOOSTRIX) injection 0.5 mL (0.5 mLs Intramuscular Given 02/26/17 2039)    BP 136/73   Pulse (!) 105   Temp 99.5 F (37.5 C) (Oral)   Resp 16   SpO2 100%  No data found.   Physical Exam  Constitutional: She is oriented to person, place, and time. She appears well-developed. No distress.  HENT:  Head: Normocephalic and atraumatic.  Eyes: Conjunctivae are normal. Pupils are equal, round, and reactive to light.  Musculoskeletal:  Fish hook on right lateral side of foot between 4th and 5th toe.   Neurological: She is alert and oriented to person, place, and time.  Skin: Skin is warm and dry.  Psychiatric: She has a normal mood and affect. Her behavior is normal. Judgment normal.    Urgent  Care Course     Procedures (including critical care time)  Labs Review Labs Reviewed - No data to display  Imaging Review No results found.   Visual Acuity Review  Right Eye Distance:   Left Eye Distance:   Bilateral Distance:    Right Eye Near:   Left Eye Near:    Bilateral Near:         MDM   1. Fish hook injury of lower leg, right, initial encounter    1. Fish hook removed in office. Patient tolerated well. Patient given Tdap in office. Start Keflex 500mg  Q6H PO x 7 days. Side effects of medicines discussed with patient. Patient to monitor for increased redness, swelling, increased  warmth. Keep wound dry and clean.    Belinda Fisher, PA-C 02/26/17 2042

## 2017-02-26 NOTE — ED Triage Notes (Signed)
Fish hook embedded in right lateral distal foot; occurred approx 30 min ago.  Last Tdap 1.5 yrs ago.

## 2017-09-06 NOTE — L&D Delivery Note (Addendum)
Operative Delivery Note Pt reached complete dilation and pushed well, rotating the baby to ROA with pushing from LOP.  A two minute shoulder dystocia was then encountered with delivery of head at 8:59 and maneuvers below employed and at 9:01 PM a viable female was delivered .  Presentation: vertex; Position: Right,, Occiput,, Anterior   After McRoberts position, the posterior shoulder was able to be reached and the Woodscrew maneuver employed to bring it anterior and with good result it moved up towards the perineum.  The mother gave less effort at this point so the anterior shoulder was not delivered.  The posterior arm was again rotated to anterior and then the posterior right shoulder delivered.  At no time was excessive traction used for the anterior left shoulder, and it was delivered secondarily after the right shoulder.   Delivery of the head: 07/17/2018  8:59 PM First maneuver: 07/17/2018  8:59 PM, McRoberts Second maneuver: 07/17/2018  9:01 PM, Suprapubic Pressure Third maneuver: Posterior shoulder woodscrewed    APGARS: 6,8 ; weight pending  . Baby was floppy initially following delivery with good HR.  Cord was clamped and cut and the infant handed to the Dr. Eric Form and the NICU team for evaluation. Baby responded to stimulation and O2 and was able to return to mother for skin to skin.  Clavicles intact and moving both arms, though left slightly less than right.  Placenta status: delivered spontaneously, .   Cord:  with the following complications:none .  Cord pH: 7.12  Anesthesia: Epidural  Episiotomy: none Lacerations:  none Suture Repair: NA Est. Blood Loss (mL):   Mom to postpartum.  Baby to Couplet care / Skin to Skin.  Jaime Munoz 07/17/2018, 9:25 PM

## 2017-11-22 ENCOUNTER — Ambulatory Visit (HOSPITAL_COMMUNITY)
Admission: EM | Admit: 2017-11-22 | Discharge: 2017-11-22 | Disposition: A | Discharge status: Home / Self Care | Payer: BLUE CROSS/BLUE SHIELD | Attending: Family Medicine | Admitting: Family Medicine

## 2017-11-22 ENCOUNTER — Encounter (HOSPITAL_COMMUNITY): Payer: Self-pay | Admitting: Emergency Medicine

## 2017-11-22 DIAGNOSIS — M436 Torticollis: Secondary | ICD-10-CM | POA: Diagnosis not present

## 2017-11-22 MED ORDER — CYCLOBENZAPRINE HCL 5 MG PO TABS: 5.0000 mg | ORAL_TABLET | Freq: Every day | ORAL | 0 refills | Status: DC

## 2017-11-22 MED ORDER — KETOROLAC TROMETHAMINE 60 MG/2ML IM SOLN
INTRAMUSCULAR | Status: AC
  Filled 2017-11-22: qty 2

## 2017-11-22 MED ORDER — KETOROLAC TROMETHAMINE 60 MG/2ML IM SOLN
60.0000 mg | Freq: Once | INTRAMUSCULAR | Status: AC
  Administered 2017-11-22: 60 mg via INTRAMUSCULAR

## 2017-11-22 NOTE — ED Triage Notes (Signed)
Pt sts neck pain after waking this morning; pt sts feels like muscle spasm

## 2017-11-22 NOTE — ED Provider Notes (Signed)
[facility]    CSN: [id number] Arrival date & time: 11/22/17  1818     History   Chief Complaint Chief Complaint  Patient presents with  . Neck Pain    HPI Jaime Munoz is a 27 y.o. female.   Jaime Munoz presents with complaints of left neck pain after she turned it this morning, she felt a pop and has since has spasms. Pain is worse if turn neck to the left. More comfortable with turning to the right and has been holding head to the right. Denies any previous similar. Rates pain 8/10. Without headache, vision change, weakness, numbness or tingling to extremities. Took ibuprofen last at 0700 this morning. Does not take any medications regularly, and otherwise without medical history.   ROS per HPI.       Past Medical History:  Diagnosis Date  . UTI (lower urinary tract infection)     There are no active problems to display for this patient.   History reviewed. No pertinent surgical history.  OB History    No data available       Home Medications    Prior to Admission medications   Medication Sig Start Date End Date Taking? Authorizing Provider  cyclobenzaprine (FLEXERIL) 5 MG tablet Take 1 tablet (5 mg total) by mouth at bedtime. 11/22/17   Georgetta Haber, NP  fluticasone (CUTIVATE) 0.05 % cream Apply topically 2 (two) times daily. 01/16/16   Linna Hoff, MD    Family History History reviewed. No pertinent family history.  Social History Social History   Tobacco Use  . Smoking status: Never Smoker  Substance Use Topics  . Alcohol use: Yes    Comment: occasional  . Drug use: No     Allergies   Patient has no known allergies.   Review of Systems Review of Systems   Physical Exam Triage Vital Signs ED Triage Vitals [11/22/17 1859]  Enc Vitals Group     BP (!) 148/80     Pulse Rate 73     Resp 18     Temp 98.7 F (37.1 C)     Temp Source Oral     SpO2 100 %     Weight      Height      Head Circumference      Peak Flow       Pain Score      Pain Loc      Pain Edu?      Excl. in GC?    No data found.  Updated Vital Signs BP (!) 148/80 (BP Location: Right Arm)   Pulse 73   Temp 98.7 F (37.1 C) (Oral)   Resp 18   SpO2 100%   Visual Acuity Right Eye Distance:   Left Eye Distance:   Bilateral Distance:    Right Eye Near:   Left Eye Near:    Bilateral Near:     Physical Exam  Constitutional: She is oriented to person, place, and time. She appears well-developed and well-nourished. No distress.  Neck: Trachea normal. No spinous process tenderness and no muscular tenderness present. Neck rigidity present. Decreased range of motion present. No thyroid mass present.    Left neck with spasms intermittently; turning head right without difficulty; pain with left turn; mild pain with chin to chest; less pain with chin to ceiling; without bony tenderness; moving extremities without difficulty; sensation intact  Cardiovascular: Normal rate, regular rhythm and normal heart sounds.  Pulmonary/Chest: Effort  normal and breath sounds normal.  Neurological: She is alert and oriented to person, place, and time. No cranial nerve deficit or sensory deficit. She exhibits normal muscle tone. Coordination normal.  Skin: Skin is warm and dry.     UC Treatments / Results  Labs (all labs ordered are listed, but only abnormal results are displayed) Labs Reviewed - No data to display  EKG  EKG Interpretation None       Radiology No results found.  Procedures Procedures (including critical care time)  Medications Ordered in UC Medications  ketorolac (TORADOL) injection 60 mg (not administered)     Initial Impression / Assessment and Plan / UC Course  I have reviewed the triage vital signs and the nursing notes.  Pertinent labs & imaging results that were available during my care of the patient were reviewed by me and considered in my medical decision making (see chart for details).     toradol  prior to departure. Flexeril as needed. Heat application, stretches,exercises. Return precautions provided. Patient verbalized understanding and agreeable to plan.  Ambulatory out of clinic without difficulty.    Final Clinical Impressions(s) / UC Diagnoses   Final diagnoses:  Torticollis, acute    ED Discharge Orders        Ordered    cyclobenzaprine (FLEXERIL) 5 MG tablet  Daily at bedtime     11/22/17 1921       Controlled Substance Prescriptions [place] Controlled Substance Registry consulted? Not Applicable   Georgetta HaberBurky, Richard Holz B, NP 11/22/17 1930

## 2017-11-22 NOTE — Discharge Instructions (Signed)
Heat application. Muscle relaxer as needed, may cause drowsiness, do not take if driving or drinking alcohol. Stretching and exercises as able. May take additional ibuprofen tomorrow morning.  If symptoms worsen or do not improve in the next week to return to be seen or to follow up with your PCP.

## 2017-12-20 LAB — OB RESULTS CONSOLE GC/CHLAMYDIA
CHLAMYDIA, DNA PROBE: NEGATIVE
GC PROBE AMP, GENITAL: NEGATIVE

## 2017-12-20 LAB — OB RESULTS CONSOLE ABO/RH: RH Type: POSITIVE

## 2017-12-20 LAB — OB RESULTS CONSOLE HIV ANTIBODY (ROUTINE TESTING): HIV: NONREACTIVE

## 2017-12-20 LAB — OB RESULTS CONSOLE ANTIBODY SCREEN: ANTIBODY SCREEN: NEGATIVE

## 2017-12-20 LAB — OB RESULTS CONSOLE RUBELLA ANTIBODY, IGM: RUBELLA: IMMUNE

## 2017-12-20 LAB — OB RESULTS CONSOLE RPR: RPR: NONREACTIVE

## 2017-12-20 LAB — OB RESULTS CONSOLE HEPATITIS B SURFACE ANTIGEN: Hepatitis B Surface Ag: NEGATIVE

## 2018-06-29 LAB — OB RESULTS CONSOLE GBS: STREP GROUP B AG: NEGATIVE

## 2018-07-07 ENCOUNTER — Encounter (HOSPITAL_COMMUNITY): Payer: Self-pay | Admitting: *Deleted

## 2018-07-07 ENCOUNTER — Telehealth (HOSPITAL_COMMUNITY): Payer: Self-pay | Admitting: *Deleted

## 2018-07-07 NOTE — Telephone Encounter (Signed)
Preadmission screen  

## 2018-07-16 NOTE — H&P (Signed)
Jaime Munoz is a 27 y.o. female at 69 0/7 weeks (EDD 07/24/18 by 8 week [place])  presenting for IOL at term. Prenatal care uneventful except for gestational thrombocytopenia with platelets ranging from  111K-123K most recently and stable.    OB History    Gravida  2   Para  1   Term  1   Preterm      AB      Living  1     SAB      TAB      Ectopic      Multiple      Live Births  1         2010 NSVD 6#10oz  Past Medical History:  Diagnosis Date  . Hx of acute pyelonephritis   . UTI (lower urinary tract infection)    Past Surgical History:  Procedure Laterality Date  . NO PAST SURGERIES     Family History: family history is not on file. Social History:  reports that she has never smoked. She has never used smokeless tobacco. She reports that she drinks alcohol. She reports that she does not use drugs.     Maternal Diabetes: No Genetic Screening: Normal Maternal Ultrasounds/Referrals: Normal Fetal Ultrasounds or other Referrals:  None Maternal Substance Abuse:  No Significant Maternal Medications:  None Significant Maternal Lab Results:  None Other Comments:  None  Review of Systems  Eyes: Negative for blurred vision.  Gastrointestinal: Negative for abdominal pain.  Musculoskeletal: Negative for back pain.   Maternal Medical History:  Contractions: Frequency: irregular.   Perceived severity is mild.    Fetal activity: Perceived fetal activity is normal.    Prenatal Complications - Diabetes: none.      There were no vitals taken for this visit. Maternal Exam:  Uterine Assessment: Contraction strength is mild.  Contraction frequency is irregular.   Abdomen: Patient reports no abdominal tenderness. Fetal presentation: vertex  Introitus: Normal vulva. Normal vagina.    Physical Exam  Constitutional: She appears well-developed.  Cardiovascular: Normal rate and regular rhythm.  Respiratory: Effort normal.  GI: Soft.  Neurological: She is  alert.  Psychiatric: She has a normal mood and affect.    Prenatal labs: ABO, Rh: A/Positive/-- (04/16 0000) Antibody: Negative (04/16 0000) Rubella: Immune (04/16 0000) RPR: Nonreactive (04/16 0000)  HBsAg: Negative (04/16 0000)  HIV: Non-reactive (04/16 0000)  GBS: Negative (10/24 0000)  First trimester screen and AFP WNL One hour GCT 86 Essential panel negative  Assessment/Plan: Pt here for IOL at term.  Prior uneventful vaginal delivery.  Plan pitocin and AROM when able.  Epidural prn.  Oliver Pila 07/16/2018, 6:29 PM

## 2018-07-17 ENCOUNTER — Inpatient Hospital Stay (HOSPITAL_COMMUNITY)
Admission: RE | Admit: 2018-07-17 | Discharge: 2018-07-19 | DRG: 807 | Disposition: A | Discharge status: Home / Self Care | Payer: BLUE CROSS/BLUE SHIELD | Attending: Obstetrics and Gynecology | Admitting: Obstetrics and Gynecology

## 2018-07-17 ENCOUNTER — Inpatient Hospital Stay (HOSPITAL_COMMUNITY): Payer: BLUE CROSS/BLUE SHIELD | Admitting: Anesthesiology

## 2018-07-17 ENCOUNTER — Other Ambulatory Visit: Payer: Self-pay

## 2018-07-17 ENCOUNTER — Encounter (HOSPITAL_COMMUNITY): Payer: Self-pay

## 2018-07-17 DIAGNOSIS — O99214 Obesity complicating childbirth: Secondary | ICD-10-CM | POA: Diagnosis present

## 2018-07-17 DIAGNOSIS — O9912 Other diseases of the blood and blood-forming organs and certain disorders involving the immune mechanism complicating childbirth: Principal | ICD-10-CM | POA: Diagnosis present

## 2018-07-17 DIAGNOSIS — Z3A39 39 weeks gestation of pregnancy: Secondary | ICD-10-CM | POA: Diagnosis not present

## 2018-07-17 DIAGNOSIS — D6959 Other secondary thrombocytopenia: Secondary | ICD-10-CM | POA: Diagnosis present

## 2018-07-17 LAB — TYPE AND SCREEN
ABO/RH(D): A POS
Antibody Screen: NEGATIVE

## 2018-07-17 LAB — CBC
HCT: 35.5 % — ABNORMAL LOW (ref 36.0–46.0)
Hemoglobin: 11.1 g/dL — ABNORMAL LOW (ref 12.0–15.0)
MCH: 24.6 pg — ABNORMAL LOW (ref 26.0–34.0)
MCHC: 31.3 g/dL (ref 30.0–36.0)
MCV: 78.7 fL — AB (ref 80.0–100.0)
NRBC: 0 % (ref 0.0–0.2)
PLATELETS: 163 10*3/uL (ref 150–400)
RBC: 4.51 MIL/uL (ref 3.87–5.11)
RDW: 16.7 % — ABNORMAL HIGH (ref 11.5–15.5)
WBC: 10.5 10*3/uL (ref 4.0–10.5)

## 2018-07-17 LAB — RPR: RPR: NONREACTIVE

## 2018-07-17 LAB — ABO/RH: ABO/RH(D): A POS

## 2018-07-17 MED ORDER — DIPHENHYDRAMINE HCL 25 MG PO CAPS: 25.0000 mg | ORAL_CAPSULE | Freq: Four times a day (QID) | ORAL | Status: DC | PRN

## 2018-07-17 MED ORDER — PHENYLEPHRINE 40 MCG/ML (10ML) SYRINGE FOR IV PUSH (FOR BLOOD PRESSURE SUPPORT)
80.0000 ug | PREFILLED_SYRINGE | INTRAVENOUS | Status: DC | PRN
  Filled 2018-07-17: qty 5

## 2018-07-17 MED ORDER — SENNOSIDES-DOCUSATE SODIUM 8.6-50 MG PO TABS
2.0000 | ORAL_TABLET | ORAL | Status: DC
  Administered 2018-07-18 (×2): 2 via ORAL
  Filled 2018-07-17 (×2): qty 2

## 2018-07-17 MED ORDER — FENTANYL CITRATE (PF) 100 MCG/2ML IJ SOLN
50.0000 ug | INTRAMUSCULAR | Status: DC | PRN
  Administered 2018-07-17: 100 ug via INTRAVENOUS
  Filled 2018-07-17: qty 2

## 2018-07-17 MED ORDER — WITCH HAZEL-GLYCERIN EX PADS: 1.0000 "application " | MEDICATED_PAD | CUTANEOUS | Status: DC | PRN

## 2018-07-17 MED ORDER — SOD CITRATE-CITRIC ACID 500-334 MG/5ML PO SOLN: 30.0000 mL | ORAL | Status: DC | PRN

## 2018-07-17 MED ORDER — ZOLPIDEM TARTRATE 5 MG PO TABS: 5.0000 mg | ORAL_TABLET | Freq: Every evening | ORAL | Status: DC | PRN

## 2018-07-17 MED ORDER — COCONUT OIL OIL
1.0000 "application " | TOPICAL_OIL | Status: DC | PRN
  Administered 2018-07-18: 1 via TOPICAL
  Filled 2018-07-17: qty 120

## 2018-07-17 MED ORDER — TERBUTALINE SULFATE 1 MG/ML IJ SOLN
0.2500 mg | Freq: Once | INTRAMUSCULAR | Status: DC | PRN
  Filled 2018-07-17: qty 1

## 2018-07-17 MED ORDER — EPHEDRINE 5 MG/ML INJ
10.0000 mg | INTRAVENOUS | Status: DC | PRN
  Filled 2018-07-17: qty 2

## 2018-07-17 MED ORDER — ACETAMINOPHEN 325 MG PO TABS
650.0000 mg | ORAL_TABLET | ORAL | Status: DC | PRN
  Administered 2018-07-18: 650 mg via ORAL
  Filled 2018-07-17: qty 2

## 2018-07-17 MED ORDER — PRENATAL MULTIVITAMIN CH
1.0000 | ORAL_TABLET | Freq: Every day | ORAL | Status: DC
  Administered 2018-07-18 – 2018-07-19 (×2): 1 via ORAL
  Filled 2018-07-17 (×2): qty 1

## 2018-07-17 MED ORDER — LACTATED RINGERS IV SOLN
500.0000 mL | Freq: Once | INTRAVENOUS | Status: AC
  Administered 2018-07-17: 500 mL via INTRAVENOUS

## 2018-07-17 MED ORDER — PHENYLEPHRINE 40 MCG/ML (10ML) SYRINGE FOR IV PUSH (FOR BLOOD PRESSURE SUPPORT)
80.0000 ug | PREFILLED_SYRINGE | INTRAVENOUS | Status: DC | PRN
  Administered 2018-07-17 (×2): 40 ug via INTRAVENOUS
  Filled 2018-07-17: qty 10
  Filled 2018-07-17: qty 5

## 2018-07-17 MED ORDER — ACETAMINOPHEN 325 MG PO TABS: 650.0000 mg | ORAL_TABLET | ORAL | Status: DC | PRN

## 2018-07-17 MED ORDER — ONDANSETRON HCL 4 MG PO TABS: 4.0000 mg | ORAL_TABLET | ORAL | Status: DC | PRN

## 2018-07-17 MED ORDER — OXYTOCIN BOLUS FROM INFUSION
500.0000 mL | Freq: Once | INTRAVENOUS | Status: AC
  Administered 2018-07-17: 500 mL via INTRAVENOUS

## 2018-07-17 MED ORDER — OXYCODONE-ACETAMINOPHEN 5-325 MG PO TABS: 1.0000 | ORAL_TABLET | ORAL | Status: DC | PRN

## 2018-07-17 MED ORDER — BUPIVACAINE HCL (PF) 0.25 % IJ SOLN
INTRAMUSCULAR | Status: DC | PRN
  Administered 2018-07-17: 8 mL/h via EPIDURAL

## 2018-07-17 MED ORDER — LIDOCAINE HCL (PF) 1 % IJ SOLN
30.0000 mL | INTRAMUSCULAR | Status: DC | PRN
  Filled 2018-07-17: qty 30

## 2018-07-17 MED ORDER — LACTATED RINGERS IV SOLN
INTRAVENOUS | Status: DC
  Administered 2018-07-17: 1000 mL via INTRAVENOUS

## 2018-07-17 MED ORDER — ONDANSETRON HCL 4 MG/2ML IJ SOLN
4.0000 mg | Freq: Four times a day (QID) | INTRAMUSCULAR | Status: DC | PRN
  Administered 2018-07-17: 4 mg via INTRAVENOUS
  Filled 2018-07-17: qty 2

## 2018-07-17 MED ORDER — TETANUS-DIPHTH-ACELL PERTUSSIS 5-2.5-18.5 LF-MCG/0.5 IM SUSP: 0.5000 mL | Freq: Once | INTRAMUSCULAR | Status: DC

## 2018-07-17 MED ORDER — BENZOCAINE-MENTHOL 20-0.5 % EX AERO: 1.0000 "application " | INHALATION_SPRAY | CUTANEOUS | Status: DC | PRN

## 2018-07-17 MED ORDER — FENTANYL CITRATE (PF) 100 MCG/2ML IJ SOLN
INTRAMUSCULAR | Status: DC | PRN
  Administered 2018-07-17: 100 ug via EPIDURAL

## 2018-07-17 MED ORDER — DIBUCAINE 1 % RE OINT: 1.0000 "application " | TOPICAL_OINTMENT | RECTAL | Status: DC | PRN

## 2018-07-17 MED ORDER — LACTATED RINGERS IV SOLN: 500.0000 mL | INTRAVENOUS | Status: DC | PRN

## 2018-07-17 MED ORDER — LIDOCAINE HCL (PF) 1 % IJ SOLN
INTRAMUSCULAR | Status: DC | PRN
  Administered 2018-07-17: 13 mL via EPIDURAL

## 2018-07-17 MED ORDER — IBUPROFEN 600 MG PO TABS
600.0000 mg | ORAL_TABLET | Freq: Four times a day (QID) | ORAL | Status: DC
  Administered 2018-07-18 – 2018-07-19 (×7): 600 mg via ORAL
  Filled 2018-07-17 (×7): qty 1

## 2018-07-17 MED ORDER — FENTANYL 2.5 MCG/ML BUPIVACAINE 1/10 % EPIDURAL INFUSION (WH - ANES)
14.0000 mL/h | INTRAMUSCULAR | Status: DC | PRN
  Administered 2018-07-17 (×3): 14 mL/h via EPIDURAL
  Filled 2018-07-17 (×2): qty 100

## 2018-07-17 MED ORDER — OXYTOCIN 40 UNITS IN LACTATED RINGERS INFUSION - SIMPLE MED
1.0000 m[IU]/min | INTRAVENOUS | Status: DC
  Administered 2018-07-17: 2 m[IU]/min via INTRAVENOUS
  Filled 2018-07-17: qty 1000

## 2018-07-17 MED ORDER — ONDANSETRON HCL 4 MG/2ML IJ SOLN: 4.0000 mg | INTRAMUSCULAR | Status: DC | PRN

## 2018-07-17 MED ORDER — DIPHENHYDRAMINE HCL 50 MG/ML IJ SOLN: 12.5000 mg | INTRAMUSCULAR | Status: DC | PRN

## 2018-07-17 MED ORDER — SIMETHICONE 80 MG PO CHEW: 80.0000 mg | CHEWABLE_TABLET | ORAL | Status: DC | PRN

## 2018-07-17 MED ORDER — OXYTOCIN 40 UNITS IN LACTATED RINGERS INFUSION - SIMPLE MED: 2.5000 [IU]/h | INTRAVENOUS | Status: DC

## 2018-07-17 MED ORDER — OXYCODONE-ACETAMINOPHEN 5-325 MG PO TABS: 2.0000 | ORAL_TABLET | ORAL | Status: DC | PRN

## 2018-07-17 NOTE — Progress Notes (Signed)
This note also relates to the following rows which could not be included: BP - Cannot attach notes to unvalidated device data Pulse Rate - Cannot attach notes to unvalidated device data  80 mcg phenylephrine IVP

## 2018-07-17 NOTE — Anesthesia Preprocedure Evaluation (Signed)
Anesthesia Evaluation  Patient identified by MRN, date of birth, ID band Patient awake    Reviewed: Allergy & Precautions, NPO status , Patient's Chart, lab work & pertinent test results  Airway Mallampati: II  TM Distance: >3 FB Neck ROM: Full    Dental no notable dental hx.    Pulmonary neg pulmonary ROS,    Pulmonary exam normal breath sounds clear to auscultation       Cardiovascular negative cardio ROS Normal cardiovascular exam Rhythm:Regular Rate:Normal     Neuro/Psych negative neurological ROS  negative psych ROS   GI/Hepatic negative GI ROS, Neg liver ROS,   Endo/Other  Morbid obesity  Renal/GU negative Renal ROS  negative genitourinary   Musculoskeletal negative musculoskeletal ROS (+)   Abdominal   Peds negative pediatric ROS (+)  Hematology negative hematology ROS (+)   Anesthesia Other Findings   Reproductive/Obstetrics (+) Pregnancy                             Anesthesia Physical Anesthesia Plan  ASA: III  Anesthesia Plan: Epidural   Post-op Pain Management:    Induction:   PONV Risk Score and Plan:   Airway Management Planned:   Additional Equipment:   Intra-op Plan:   Post-operative Plan:   Informed Consent:   Plan Discussed with:   Anesthesia Plan Comments:         Anesthesia Quick Evaluation  

## 2018-07-17 NOTE — Progress Notes (Signed)
This note also relates to the following rows which could not be included: BP - Cannot attach notes to unvalidated device data Pulse Rate - Cannot attach notes to unvalidated device data  Fentanyl 100 mcg admin by Dr Richardson Landry via epidural line.

## 2018-07-17 NOTE — Anesthesia Procedure Notes (Signed)
Epidural Patient location during procedure: OB Start time: 07/17/2018 1:50 PM End time: 07/17/2018 2:05 PM  Staffing Anesthesiologist: Lowella Curb, MD Performed: anesthesiologist   Preanesthetic Checklist Completed: patient identified, site marked, surgical consent, pre-op evaluation, timeout performed, IV checked, risks and benefits discussed and monitors and equipment checked  Epidural Patient position: sitting Prep: ChloraPrep Patient monitoring: heart rate, cardiac monitor, continuous pulse ox and blood pressure Approach: midline Location: L2-L3 Injection technique: LOR saline  Needle:  Needle type: Tuohy  Needle gauge: 17 G Needle length: 9 cm Needle insertion depth: 8 cm Catheter type: closed end flexible Catheter size: 20 Guage Catheter at skin depth: 14 cm Test dose: negative  Assessment Events: blood not aspirated, injection not painful, no injection resistance, negative IV test and no paresthesia  Additional Notes Reason for block:procedure for pain

## 2018-07-17 NOTE — Progress Notes (Signed)
This note also relates to the following rows which could not be included: BP - Cannot attach notes to unvalidated device data Pulse Rate - Cannot attach notes to unvalidated device data  Pt sitting upright to eat.

## 2018-07-17 NOTE — Progress Notes (Signed)
Patient ID: Jaime Munoz, female   DOB: [birth year], 27 y.o.   MRN: [id number] Contractions still mild per pt, but increasing Pitocin just at 8mu now afeb VSS  Category 1 tracing  Cervix 3/70/-2  Continue to increase pitocin Follow progress

## 2018-07-17 NOTE — Progress Notes (Signed)
Patient ID: Jaime Munoz, female   DOB: [birth year], 27 y.o.   MRN: [id number] Pt feeling some pressure, better since redose  afeb vss   Cervix 80/7-8/-1 to 0 OP  FHR Category 1 with occasional mild variables and early decelerations Placed in right exaggerated Sims

## 2018-07-17 NOTE — Progress Notes (Signed)
Patient ID: Jaime Munoz, female   DOB: 1991/05/13, 27 y.o.   MRN: [id number] Feeling some rectal pressure  afeb vss FHR with good variability and accels, some mild early decelerations  80/6/-1 Prob OP  Placed in left exaggerated Sims Continue to follow progress

## 2018-07-17 NOTE — Plan of Care (Signed)

## 2018-07-17 NOTE — Progress Notes (Signed)
This note also relates to the following rows which could not be included: BP - Cannot attach notes to unvalidated device data Pulse Rate - Cannot attach notes to unvalidated device data  Pt may be off monitor during epidural

## 2018-07-17 NOTE — Anesthesia Pain Management Evaluation Note (Signed)
  CR[facility] Pain Management Visit Note  Patient: Jaime Munoz, 27 y.o., female  "Hello I am a member of the anesthesia team at [facility]. We have an anesthesia team available at all times to provide care throughout the hospital, including epidural management and anesthesia for C-section. I don't know your plan for the delivery whether it a natural birth, water birth, IV sedation, nitrous supplementation, doula or epidural, but we want to meet your pain goals."   1.Was your pain managed to your expectations on prior hospitalizations?   Yes   2.What is your expectation for pain management during this hospitalization?     Epidural  3.How can we help you reach that goal? Support PRN  Record the patient's initial score and the patient's pain goal.   Pain: 0  Pain Goal: 5 [facility] wants you to be able to say your pain was always managed very well.  Jennelle Human 07/17/2018

## 2018-07-17 NOTE — Progress Notes (Signed)
Patient ID: Jaime Munoz, female   DOB: 11/22/1990, 27 y.o.   MRN: [id number] Pt feeling mild occasional contractions  afeb VSS FHR category 1  Cervix 2/70/-2  AROM clear Pitocin per protocol Follow progress and epidural prn

## 2018-07-18 LAB — CBC
HEMATOCRIT: 31.6 % — AB (ref 36.0–46.0)
HEMOGLOBIN: 10 g/dL — AB (ref 12.0–15.0)
MCH: 24.9 pg — AB (ref 26.0–34.0)
MCHC: 31.6 g/dL (ref 30.0–36.0)
MCV: 78.6 fL — ABNORMAL LOW (ref 80.0–100.0)
Platelets: 140 10*3/uL — ABNORMAL LOW (ref 150–400)
RBC: 4.02 MIL/uL (ref 3.87–5.11)
RDW: 16.7 % — ABNORMAL HIGH (ref 11.5–15.5)
WBC: 19.8 10*3/uL — ABNORMAL HIGH (ref 4.0–10.5)
nRBC: 0 % (ref 0.0–0.2)

## 2018-07-18 NOTE — Lactation Note (Signed)
This note was copied from a baby's chart. Lactation Consultation Note  Patient Name: Jaime Munoz Today's Date: 07/18/2018   Mother in shower.  LC will follow up later today.     Maternal Data    Feeding    LATCH Score                   Interventions    Lactation Tools Discussed/Used     Consult Status      Jaime Munoz 07/18/2018, 10:52 AM

## 2018-07-18 NOTE — Progress Notes (Signed)
PPD #1 No problems Afeb, VSS Fundus firm, NT at U-1 Continue routine postpartum care 

## 2018-07-18 NOTE — Lactation Note (Signed)
This note was copied from a baby's chart. Lactation Consultation Note  Patient Name: Jaime Munoz Today's Date: 07/18/2018 Reason for consult: Initial assessment   P2, Baby 14 hours old and sleeping STS on mother's chest after bath. Reviewed hand expression with strong stream ejection of colostrum. Mother denies problems with breastfeeding and really wants to at this time exclusively breastfeed - unlike her first child in which she supplemented with formula. Mom encouraged to feed baby 8-12 times/24 hours and with feeding cues.  Discussed basics. Mom made aware of O/P services, breastfeeding support groups, community resources, and our phone # for post-discharge questions.     Maternal Data Has patient been taught Hand Expression?: Yes Does the patient have breastfeeding experience prior to this delivery?: Yes  Feeding    LATCH Score                   Interventions Interventions: Breast feeding basics reviewed;Hand express  Lactation Tools Discussed/Used     Consult Status Consult Status: Follow-up Date: 07/19/18 Follow-up type: In-patient    Dahlia ByesBerkelhammer, Lonia [facility] 07/18/2018, 11:42 AM

## 2018-07-18 NOTE — Lactation Note (Signed)
This note was copied from a baby's chart. Lactation Consultation Note Attempted to consult mom 2 different times. Everyone in room sleeping. Mom has multiple support people in rm.   Patient Name: Jaime Munoz Today's Date: 07/18/2018     Maternal Data    Feeding Feeding Type: Breast Fed  LATCH Score                   Interventions    Lactation Tools Discussed/Used     Consult Status      Jonan Seufert G 07/18/2018, 5:02 AM

## 2018-07-18 NOTE — Anesthesia Postprocedure Evaluation (Signed)
Anesthesia Post Note  Patient: Jaime Munoz  Procedure(s) Performed: AN AD HOC LABOR EPIDURAL     Patient location during evaluation: Mother Baby Anesthesia Type: Epidural Level of consciousness: awake and alert, oriented and patient cooperative Pain management: pain level controlled Vital Signs Assessment: post-procedure vital signs reviewed and stable Respiratory status: spontaneous breathing Cardiovascular status: stable Postop Assessment: no headache, epidural receding, patient able to bend at knees and no signs of nausea or vomiting Anesthetic complications: no Comments: Pain score 0.    Last Vitals:  Vitals:   07/18/18 0044 07/18/18 0500  BP: 133/61 113/79  Pulse: 88 73  Resp: 18 18  Temp: 36.8 C 37.1 C  SpO2:      Last Pain:  Vitals:   07/18/18 0720  TempSrc:   PainSc: 0-No pain   Pain Goal: Patients Stated Pain Goal: 5 (07/17/18 0840)               Merrilyn PumaWRINKLE,[name]

## 2018-07-19 MED ORDER — PRENATAL 27-0.8 MG PO TABS: 1.0000 | ORAL_TABLET | Freq: Every day | ORAL | 3 refills | Status: DC

## 2018-07-19 MED ORDER — IBUPROFEN 600 MG PO TABS: 600.0000 mg | ORAL_TABLET | Freq: Four times a day (QID) | ORAL | 1 refills | Status: DC | PRN

## 2018-07-19 NOTE — Progress Notes (Signed)
Post Partum Day 2 Subjective: no complaints, up ad lib, voiding, tolerating PO and nl lochia, pain controlled  Objective: Blood pressure (!) 117/52, pulse 85, temperature 97.9 F (36.6 C), temperature source Oral, resp. rate 16, height 5\' 5"  (1.651 m), weight 124.7 kg, SpO2 100 %, unknown if currently breastfeeding.  Physical Exam:  General: alert and no distress Lochia: appropriate Uterine Fundus: firm   Recent Labs    07/17/18 0810 07/18/18 0553  HGB 11.1* 10.0*  HCT 35.5* 31.6*    Assessment/Plan: Discharge home, Breastfeeding and Lactation consult.  Routine care.  D/c with motrin and PNV.  F/u 6 weeks.     LOS: 2 days   Jayvian Escoe Bovard-Stuckert 07/19/2018, 8:05 AM

## 2018-07-19 NOTE — Discharge Summary (Signed)
OB Discharge Summary     Patient Name: Jaime Munoz DOB: Mar 31, 1991 MRN: [id number]  Date of admission: 07/17/2018 Delivering MD: Huel Cote   Date of discharge: 07/19/2018  Admitting diagnosis: 39WKS INDUCTION  Intrauterine pregnancy: [redacted]w[redacted]d     Secondary diagnosis:  Active Problems:   Indication for care in labor and delivery, antepartum   Shoulder dystocia, delivered  Additional problems: N/A     Discharge diagnosis: Term Pregnancy Delivered                                                                                                Post partum procedures:N/A  Augmentation: AROM and Pitocin  Complications: None  Hospital course:  Induction of Labor With Vaginal Delivery   27 y.o. yo G2P2002 at [redacted]w[redacted]d was admitted to the hospital 07/17/2018 for induction of labor.  Indication for induction: Favorable cervix at term.  Patient had an uncomplicated labor course as follows: Membrane Rupture Time/Date: 9:16 AM ,07/17/2018   Intrapartum Procedures: Episiotomy: None [1]                                         Lacerations:  None [1]  Patient had delivery of a Viable infant.  Information for the patient's newborn:  Lottman, Girl Dinita [[id number]]  Delivery Method: Vag-Spont   07/17/2018  Details of delivery can be found in separate delivery note.  Patient had a routine postpartum course. Patient is discharged home 07/19/18.  Physical exam  Vitals:   07/18/18 0905 07/18/18 1459 07/18/18 2317 07/19/18 0608  BP: (!) 110/52 (!) 119/55 (!) 116/55 (!) 117/52  Pulse: 84 78 81 85  Resp: 18 19  16   Temp: 97.8 F (36.6 C) 98.5 F (36.9 C) 98.1 F (36.7 C) 97.9 F (36.6 C)  TempSrc: Oral Oral Oral Oral  SpO2: 100% 100% 100% 100%  Weight:      Height:       General: alert and no distress Lochia: appropriate Uterine Fundus: firm  Labs: Lab Results  Component Value Date   WBC 19.8 (H) 07/18/2018   HGB 10.0 (L) 07/18/2018   HCT 31.6 (L) 07/18/2018   MCV 78.6  (L) 07/18/2018   PLT 140 (L) 07/18/2018   CMP Latest Ref Rng & Units 03/22/2009  Glucose 70 - 99 mg/dL 85  BUN 6 - 23 mg/dL <1 REPEATED TO VERIFY(L)  Creatinine 0.4 - 1.2 mg/dL 7.82  Sodium 956 - 213 mEq/L 131(L)  Potassium 3.5 - 5.1 mEq/L 3.4(L)  Chloride 96 - 112 mEq/L 100  CO2 19 - 32 mEq/L 21  Calcium 8.4 - 10.5 mg/dL 8.7  Total Protein 6.0 - 8.3 g/dL 6.5  Total Bilirubin 0.3 - 1.2 mg/dL 0.8  Alkaline Phos 39 - 117 U/L 91  AST 0 - 37 U/L 18  ALT 0 - 35 U/L 15    Discharge instruction: per After Visit Summary and "Baby and Me Booklet".  After visit meds:  Allergies as of 07/19/2018  Reactions   Latex Itching      Medication List    STOP taking these medications   cyclobenzaprine 5 MG tablet Commonly known as:  FLEXERIL   fluticasone 0.05 % cream Commonly known as:  CUTIVATE     TAKE these medications   acetaminophen 500 MG tablet Commonly known as:  TYLENOL Take 1,000 mg by mouth every 6 (six) hours as needed for headache.   ibuprofen 600 MG tablet Commonly known as:  ADVIL,MOTRIN Take 1 tablet (600 mg total) by mouth every 6 (six) hours as needed.   multivitamin-prenatal 27-0.8 MG Tabs tablet Take 1 tablet by mouth daily at 12 noon.       Diet: routine diet  Activity: Advance as tolerated. Pelvic rest for 6 weeks.   Outpatient follow up:6 weeks Follow up Appt:No future appointments. Follow up Visit:No follow-ups on file.  Postpartum contraception: Undecided  Newborn Data: Live born female  Birth Weight: 9 lb 1.3 oz (4119 g) APGAR: 6, 8  Newborn Delivery   Time head delivered:  07/17/2018 20:59:00 Birth date/time:  07/17/2018 21:01:00 Delivery type:  Vaginal, Spontaneous     Baby Feeding: Breast Disposition:home with mother   07/19/2018 Sherian ReinJody Bovard-Stuckert, MD

## 2018-07-19 NOTE — Lactation Note (Signed)
This note was copied from a baby's chart. Lactation Consultation Note  Patient Name: Girl Genessa Markell Today's Date: 07/19/2018 Reason for consult: Mother's request;Follow-up assessment;Term P2, 29 hour female infant, elevated billi level blood serum re-drawn in morning. Per mom, infant had 5 voids and 4 stools. Per mom, had a lot of visitors infant more fussy and not latching. Mom latched infant in cross cradle hold left breast, infant open mouth with wide gape and audible swallows observed. BF for 8 minutes. Mom has good volume easily hand expressed 12 ml of colostrum, infant only take 10 ml at this feeding, infant feed by spoon did not like curve tip syringe. Mom shown how to use DEBP & how to disassemble, clean, & reassemble parts. Mom plans to BF infant and then give back EBM after feedings. Mom will use DEBP after feeding infant at breast.  Maternal Data    Feeding Feeding Type: Breast Fed  LATCH Score Latch: Repeated attempts needed to sustain latch, nipple held in mouth throughout feeding, stimulation needed to elicit sucking reflex.  Audible Swallowing: Spontaneous and intermittent  Type of Nipple: Everted at rest and after stimulation  Comfort (Breast/Nipple): Soft / non-tender  Hold (Positioning): Assistance needed to correctly position infant at breast and maintain latch.  LATCH Score: 8  Interventions Interventions: Assisted with latch;Hand express;DEBP;Adjust position;Breast compression  Lactation Tools Discussed/Used Pump Review: Setup, frequency, and cleaning Initiated by:: Danelle Earthlyobin Nialah Saravia Date initiated:: 07/19/18   Consult Status Consult Status: Follow-up Date: 07/19/18    Danelle EarthlyRobin Gisel Vipond 07/19/2018, 2:27 AM

## 2018-07-19 NOTE — Lactation Note (Signed)
This note was copied from a baby's chart. Lactation Consultation Note  Patient Name: Girl Jaqlyn Chichester Today's Date: 07/19/2018 Reason for consult: Follow-up assessment;Term  P2 mother whose infant is now 8738 hours old. Baby is under phototherapy and will not be discharged today.  Mother was breast feeding in the football hold on the left breast when I arrived.  Baby was not latched on deeply.  Discussed the importance of a deep latch with mother and demonstrated better positioning.  Mother was worried that baby "could not breathe" if in tight to the breast.  After repositioning baby began sucking much more actively and mother denied pain.  She stated that sometimes she felt like baby was just "on the end" of her nipple.  I encouraged her to continue feeding until baby self released and to offer the second breast if she shows feeding cues.  Mother will continue hand expression and feed back any EBM via spoon that she obtains.  Baby will be getting another bilirubin level drawn at 1700 today.  Mother has a DEBP for home use.  She will call for latch assistance as needed.   Maternal Data Formula Feeding for Exclusion: No Has patient been taught Hand Expression?: Yes Does the patient have breastfeeding experience prior to this delivery?: Yes  Feeding Feeding Type: Breast Fed  LATCH Score Latch: Grasps breast easily, tongue down, lips flanged, rhythmical sucking.  Audible Swallowing: Spontaneous and intermittent  Type of Nipple: Everted at rest and after stimulation  Comfort (Breast/Nipple): Soft / non-tender  Hold (Positioning): Assistance needed to correctly position infant at breast and maintain latch.  LATCH Score: 9  Interventions Interventions: Breast feeding basics reviewed;Assisted with latch;Skin to skin;Breast massage;Hand express;Position options;Support pillows;Adjust position;Breast compression;DEBP  Lactation Tools Discussed/Used     Consult Status Consult  Status: Follow-up Date: 07/20/18 Follow-up type: In-patient    Quame Spratlin R Jacie Tristan 07/19/2018, 11:56 AM

## 2018-07-20 ENCOUNTER — Ambulatory Visit: Payer: Self-pay

## 2018-07-20 NOTE — Lactation Note (Signed)
This note was copied from a baby's chart. Lactation Consultation Note  Patient Name: Jaime Munoz Today's Date: 07/20/2018 Reason for consult: Follow-up assessment;Hyperbilirubinemia Breasts are filling.  Baby remains on phototherapy.  She is 3261 hours old and at a 4% weight loss.  Baby will have a repeat bili at 1700 and possible discharge.  Baby is eating frequently but becomes sleepy at breast.  Instructed on using good breast massage and compression.  Baby fussing so assisted with placing baby skin to skin in football hold.  Instructed on hand expression and milk expressed in large amounts.  Baby latched easily and well.  Mom c/o initial latch on pain that subsides as feeding progresses.  Suck:swallow ration 1:!.  Observed feeding for 10 minutes and baby still actively feeding when I left.  Mom has a breast pump for home use.  Lactation outpatient services and support information reviewed and encouraged prn.  Instructed to call for assist/concerns today.  Maternal Data    Feeding Feeding Type: Breast Fed  LATCH Score Latch: Grasps breast easily, tongue down, lips flanged, rhythmical sucking.  Audible Swallowing: Spontaneous and intermittent  Type of Nipple: Everted at rest and after stimulation  Comfort (Breast/Nipple): Soft / non-tender  Hold (Positioning): No assistance needed to correctly position infant at breast.  LATCH Score: 10  Interventions    Lactation Tools Discussed/Used     Consult Status Consult Status: PRN    Huston FoleyMOULDEN, Jennessa Trigo S 07/20/2018, 10:03 AM

## 2021-10-27 ENCOUNTER — Other Ambulatory Visit: Payer: Self-pay | Admitting: Otolaryngology

## 2021-10-27 NOTE — Pre-Procedure Instructions (Signed)
Surgical Instructions    Your procedure is scheduled on Wednesday 11/04/21.   Report to [facility] Main Entrance "A" at 08:00 A.M., then check in with the Admitting office.  Call this number if you have problems the morning of surgery:  [phone]   If you have any questions prior to your surgery date call [phone]: Open Monday-Friday 8am-4pm    Remember:  Do not eat after midnight the night before your surgery  You may drink clear liquids until 07:00 A.M. the morning of your surgery.   Clear liquids allowed are: Water, Non-Citrus Juices (without pulp), Carbonated Beverages, Clear Tea, Black Coffee ONLY (NO MILK, CREAM OR POWDERED CREAMER of any kind), and Gatorade    Take these medicines the morning of surgery with A SIP OF WATER:   acetaminophen (TYLENOL)- If needed  loratadine (CLARITIN) - If needed    As of today, STOP taking any Aspirin (unless otherwise instructed by your surgeon) Aleve, Naproxen, Ibuprofen, Motrin, Advil, Goody's, BC's, all herbal medications, fish oil, and all vitamins.           Do not wear jewelry or makeup Do not wear lotions, powders, perfumes/colognes, or deodorant. Do not shave 48 hours prior to surgery.  Men may shave face and neck. Do not bring valuables to the hospital. Do not wear nail polish, gel polish, artificial nails, or any other type of covering on natural nails (fingers and toes) If you have artificial nails or gel coating that need to be removed by a nail salon, please have this removed prior to surgery. Artificial nails or gel coating may interfere with anesthesia's ability to adequately monitor your vital signs.  [facility] is not responsible for any belongings or valuables. .   Do NOT Smoke (Tobacco/Vaping)  24 hours prior to your procedure  If you use a CPAP at night, you may bring your mask for your overnight stay.   Contacts, glasses, hearing aids, dentures or partials may not be worn into surgery, please bring cases for  these belongings   For patients admitted to the hospital, discharge time will be determined by your treatment team.   Patients discharged the day of surgery will not be allowed to drive home, and someone needs to stay with them for 24 hours.  NO VISITORS WILL BE ALLOWED IN PRE-OP WHERE PATIENTS ARE PREPPED FOR SURGERY.  ONLY 1 SUPPORT PERSON MAY BE PRESENT IN THE WAITING ROOM WHILE YOU ARE IN SURGERY.  IF YOU ARE TO BE ADMITTED, ONCE YOU ARE IN YOUR ROOM YOU WILL BE ALLOWED TWO (2) VISITORS. 1 (ONE) VISITOR MAY STAY OVERNIGHT BUT MUST ARRIVE TO THE ROOM BY 8pm.  Minor children may have two parents present. Special consideration for safety and communication needs will be reviewed on a case by case basis.  Special instructions:    Oral Hygiene is also important to reduce your risk of infection.  Remember - BRUSH YOUR TEETH THE MORNING OF SURGERY WITH YOUR REGULAR TOOTHPASTE   [facility]- Preparing For Surgery  Before surgery, you can play an important role. Because skin is not sterile, your skin needs to be as free of germs as possible. You can reduce the number of germs on your skin by washing with CHG (chlorahexidine gluconate) Soap before surgery.  CHG is an antiseptic cleaner which kills germs and bonds with the skin to continue killing germs even after washing.     Please do not use if you have an allergy to CHG or antibacterial  soaps. If your skin becomes reddened/irritated stop using the CHG.  Do not shave (including legs and underarms) for at least 48 hours prior to first CHG shower. It is OK to shave your face.  Please follow these instructions carefully.     Shower the NIGHT BEFORE SURGERY and the MORNING OF SURGERY with CHG Soap.   If you chose to wash your hair, wash your hair first as usual with your normal shampoo. After you shampoo, rinse your hair and body thoroughly to remove the shampoo.  Then [employer] and genitals (private parts) with your normal soap and rinse thoroughly  to remove soap.  After that Use CHG Soap as you would any other liquid soap. You can apply CHG directly to the skin and wash gently with a scrungie or a clean washcloth.   Apply the CHG Soap to your body ONLY FROM THE NECK DOWN.  Do not use on open wounds or open sores. Avoid contact with your eyes, ears, mouth and genitals (private parts). Wash Face and genitals (private parts)  with your normal soap.   Wash thoroughly, paying special attention to the area where your surgery will be performed.  Thoroughly rinse your body with warm water from the neck down.  DO NOT shower/wash with your normal soap after using and rinsing off the CHG Soap.  Pat yourself dry with a CLEAN TOWEL.  Wear CLEAN PAJAMAS to bed the night before surgery  Place CLEAN SHEETS on your bed the night before your surgery  DO NOT SLEEP WITH PETS.   Day of Surgery:  Take a shower with CHG soap. Wear Clean/Comfortable clothing the morning of surgery Do not apply any deodorants/lotions.   Remember to brush your teeth WITH YOUR REGULAR TOOTHPASTE.    COVID testing  If you are going to stay overnight or be admitted after your procedure/surgery and require a pre-op COVID test, please follow these instructions after your COVID test   You are not required to quarantine however you are required to wear a well-fitting mask when you are out and around people not in your household.  If your mask becomes wet or soiled, replace with a new one.  Wash your hands often with soap and water for 20 seconds or clean your hands with an alcohol-based hand sanitizer that contains at least 60% alcohol.  Do not share personal items.  Notify your provider: if you are in close contact with someone who has COVID  or if you develop a fever of 100.4 or greater, sneezing, cough, sore throat, shortness of breath or body aches.    Please read over the following fact sheets that you were given.

## 2021-10-28 ENCOUNTER — Encounter (HOSPITAL_COMMUNITY)
Admission: RE | Admit: 2021-10-28 | Discharge: 2021-10-28 | Disposition: A | Discharge status: Home / Self Care | Payer: BC Managed Care – PPO | Source: Ambulatory Visit | Attending: Otolaryngology | Admitting: Otolaryngology

## 2021-11-04 ENCOUNTER — Ambulatory Visit (HOSPITAL_COMMUNITY): Admission: RE | Admit: 2021-11-04 | Payer: BC Managed Care – PPO | Source: Home / Self Care | Admitting: Otolaryngology

## 2021-11-04 SURGERY — TONSILLECTOMY
Anesthesia: General | Laterality: Bilateral

## 2023-12-14 NOTE — Progress Notes (Signed)
 Subjective:   Ankle Pain: Jaime Munoz is a 33 y.o. female who complains of injury to the left great toe The injury occurred 2 days ago, and occurred while walking inside from patio and stubbed toe on brick.  She has foot swelling, pain in the great toe and numbness in all toes.  She also feels that the nail is loose. Review of Systems Past Medical History:  Diagnosis Date  . Hydradenitis   . Hyperhidrosis      Allergies  Allergen Reactions  . Latex Itching     History reviewed. No pertinent surgical history.   History reviewed. No pertinent family history.   Social History   Socioeconomic History  . Marital status: Single  Tobacco Use  . Smoking status: Never    Passive exposure: Past  . Smokeless tobacco: Never  Vaping Use  . Vaping status: Never Used  Substance and Sexual Activity  . Alcohol use: Yes  . Drug use: Never  . Sexual activity: Yes    Partners: Male     Objective:   Vitals:   12/14/23 1140  BP: 143/81  Pulse: 78  Resp: 16  Temp: 98.8 F (37.1 C)  SpO2: 97%   General appearance: alert, appears stated age and cooperative Ecchymosis is not observed.  There is swelling in midfoot and forefoot.  Nailbed is loose, but remains approx 75% attached.  No bony tenderness.  Decreased plantar flexion secondary to pain and swelling.  Sensation intact to light touch.  DP pulse palpable.  XR Toe(S) 2+ Views Right Result Date: 12/14/2023 3 view right great toe Clinical: stubbed toe on brick FINDINGS: 3 views demonstrate no fracture, malalignment or other acute osseous abnormality. IMPRESSION: Negative study.       Assessment:   1. Contusion of right great toe with damage to nail, initial encounter  XR Toe(S) 2+ Views Right        Plan:   1. Labs and medications ordered today: Orders Placed This Encounter  Procedures  . XR Toe(S) 2+ Views Right   Outpatient Encounter Medications as of 12/14/2023  Medication Sig Dispense Refill  . clindamycin  (CLEOCIN-T) 1% gel APPLY TO AFFECTED AREA TWICE A DAY 30 g 0  . JUNEL FE 1/20 1-20 MG-MCG per tablet Take one tablet by mouth daily.     No facility-administered encounter medications on file as of 12/14/2023.    There are no Patient Instructions on file for this visit.   2. Rest, ice, compression, elevation (RICE) therapy. OTC analgesics as needed. Follow-up with PCP in 7 days.  Advised patient to follow up with their PCP this week or if symptoms worsen return here or go to the emergency department for evaluation.  Previsit planning was completed via snapshot and review of chart.  Patient verbalized to me that they understood what their problem is, what they need to do about it, and why it is important that they do it.  The patient voices understanding of all medications. No barriers to adherence were noted. Patient is taking all medications as prescribed and is tolerating well.  Plan for follow-up as discussed or as needed if any worsening symptoms or change in condition.  After Visit Summary was given to patient. Newell Grew, FNP-BC Portions of the history and exam were entered using voice recognition software.  Minor syntax, contextual, and spelling errors may be related to the use of this software and were not intentional.  If corrections are necessary, please contact provider.

## 2024-01-12 NOTE — Progress Notes (Signed)
 Subjective   Patient ID:  Jaime Munoz is a 33 y.o. (DOB [birth year]) female    Patient presents with  . Diarrhea    Immediately after she eats, has had GI issues whole life but this is getting worse. ? Allergy to food, sometimes does have pain and nausea.     History of Present Illness The patient presents for evaluation of abdominal pain and hypertension.  She has been experiencing chronic gastrointestinal issues, which have recently escalated to the point of causing significant discomfort. Symptoms are triggered by food intake, leading to an urgent need for defecation. The severity of her abdominal pain varies, with descriptions ranging from contractions to a sensation akin to being pricked with pins. On occasion, the pain intensifies to the extent that it induces nausea or vomiting. She suspects a potential food allergy as the cause of her symptoms and is interested in undergoing allergy testing. She has not maintained a food diary but has identified dairy products as a possible trigger. She experiences bloating and reflux, which she manages without medication. Bowel issues occur at least once a week, but she does not associate them with anxiety or stress. There is no presence of blood in her stool. She has not consulted a gastroenterologist in the past. She has no history of abdominal surgeries, including gallbladder removal. Alcohol is consumed infrequently, but it exacerbates her symptoms the following day.  She acknowledges that her blood pressure is elevated, attributing it to recent life events. She was previously prescribed a low-dose antihypertensive medication by her former physician but did not adhere to the treatment plan. She was advised that weight loss could potentially normalize her blood pressure.  SOCIAL HISTORY She drinks alcohol occasionally.   Reviewed and updated this visit by provider: Tobacco  Allergies  Meds  Problems  Med Hx  Surg Hx  Fam Hx        Review of  Systems  Gastrointestinal:  Positive for diarrhea.    is complete and negative except as noted.  Objective   Vitals:   01/12/24 1203 01/12/24 1430  BP: (!) 166/109 142/88  Patient Position: Sitting   Pulse: 77   Resp: 20   Height: 5' 5 (1.651 m)   Weight: 288 lb (130.6 kg)   SpO2: 99%   BMI (Calculated): 47.9      Physical Exam Vitals and nursing note reviewed.  Constitutional:      General: She is not in acute distress.    Appearance: Normal appearance. She is obese.  Cardiovascular:     Rate and Rhythm: Normal rate and regular rhythm.     Pulses: Normal pulses.     Heart sounds: Normal heart sounds. No murmur heard. Pulmonary:     Effort: Pulmonary effort is normal. No respiratory distress.     Breath sounds: Normal breath sounds.  Abdominal:     General: Bowel sounds are normal.     Palpations: Abdomen is soft.     Tenderness: There is no abdominal tenderness. There is no right CVA tenderness or left CVA tenderness.  Skin:    General: Skin is warm and dry.     Capillary Refill: Capillary refill takes less than 2 seconds.  Neurological:     Mental Status: She is alert and oriented to person, place, and time. Mental status is at baseline.  Psychiatric:        Mood and Affect: Mood normal.        Behavior: Behavior normal.  Thought Content: Thought content normal.        Assessment and Plan  1. Generalized abdominal pain (Primary) -     Comprehensive Metabolic Panel; Future -     CBC And Differential; Future -     Alpha-Gal IgE Panel; Future -     Celiac Panel Reflex to Titer; Future -     IgE+Allergens (13); Future -     H. Pylori Antigen, Stool Stool Stool; Future 2. Essential hypertension -     amLODIPine besylate (NORVASC) 10 mg tablet; Take one tablet (10 mg dose) by mouth daily., Starting Thu 01/12/2024, Normal -     Comprehensive Metabolic Panel; Future -     Measure blood pressure 3. Nausea -     Comprehensive Metabolic Panel; Future -     CBC  And Differential; Future -     Alpha-Gal IgE Panel; Future -     Celiac Panel Reflex to Titer; Future -     IgE+Allergens (13); Future -     H. Pylori Antigen, Stool Stool Stool; Future 4. Diarrhea, unspecified type -     Comprehensive Metabolic Panel; Future -     CBC And Differential; Future -     Alpha-Gal IgE Panel; Future -     Celiac Panel Reflex to Titer; Future -     IgE+Allergens (13); Future -     H. Pylori Antigen, Stool Stool Stool; Future    Assessment & Plan 1. Abdominal pain. - The differential diagnosis includes food intolerance, irritable bowel syndrome, bacterial infection, or celiac disease. - Physical examination revealed no specific area of tenderness; no blood in stool reported. - Advised to maintain a food diary to identify potential dietary triggers. A stool study will be conducted to further investigate the cause of her symptoms. Comprehensive metabolic panel (CMP), complete blood count (CBC), alpha-gal test, celiac panel, food allergy panel, and H. pylori test will be ordered. - Depending on the results of these tests, a referral to a gastroenterologist may be considered.  2. Hypertension. - Blood pressure readings have consistently been elevated, with a reading of 143/81 on 12/14/2023 and 139/74 in 09/2023. In 2023, blood pressure readings were 152/61 and 139/94. - Advised to monitor blood pressure at home using a cuff, if available. - A prescription for amlodipine will be provided, which she is to take daily at the same time each day. Potential side effects, including ankle swelling, have been discussed. - If significant ankle swelling occurs, alternative medications will be considered.  Follow-up The patient will follow up in 1 month.  Follow up in about 4 weeks (around 02/09/2024) for HTN and GI concerns - .  Computer technology was used to create visit note.  Consent from the patient/caregiver was obtained prior to its use.      Patient's  Medications  New Prescriptions   AMLODIPINE BESYLATE (NORVASC) 10 MG TABLET    Take one tablet (10 mg dose) by mouth daily.  Previous Medications   JUNEL FE 1/20 1-20 MG-MCG PER TABLET    Take one tablet by mouth daily.  Modified Medications   No medications on file  Discontinued Medications   CLINDAMYCIN (CLEOCIN-T) 1% GEL    APPLY TO AFFECTED AREA TWICE A DAY        Risks, benefits, and alternatives of the medications and treatment plan prescribed today were discussed, and patient expressed understanding. Plan follow-up as discussed or as needed if any worsening symptoms or change in condition.  A yearly preventative health exam was recommended and current age based recommendations were discussed.

## 2024-06-18 ENCOUNTER — Encounter (HOSPITAL_BASED_OUTPATIENT_CLINIC_OR_DEPARTMENT_OTHER): Payer: Self-pay

## 2024-06-20 NOTE — Progress Notes (Signed)
 "  [facility] Initial Assessment:    Date:  06/21/2024   ID:  Jaime Munoz, DOB [birth year], MRN [id number]  PCP:  Patient, No Pcp Per  Cardiologist:  None   Referring MD: Rutherford Gain, MD   CC: Hypertension  History of Present Illness:    Jaime Munoz is a 33 y.o. female with a hx of hypertension, obesity and pre-diabetes here to establish care in the [facility]. She saw her PCP 01/2024 and BP was elevated.  She thought that life stress was contributing.  She has been on antihypertensives in the past but struggled to adhere to the medication.  She was started on amlodipine and referred to the [facility].  Discussed the use of AI scribe software for clinical note transcription with the patient, who gave verbal consent to proceed.  History of Present Illness Jaime Munoz has a history of hypertension for several years. Initially, weight loss was suggested to manage her blood pressure, but she was eventually started on medication due to consistently high readings. She has been on amlodipine since June but does not take it consistently. When taken, her blood pressure is controlled, though she does not recall specific numbers. She uses Alexa to remind her to take her medication at 8:30 AM.  She leads a sedentary lifestyle, working as a merchandiser, retail at a call center, which involves sitting most of the day. She has two children, ages 33 and 65, and her daughter participates in cheerleading. She occasionally walks around the track during her daughter's practice but struggles with consistency in physical activity. Her diet includes both home-cooked meals and eating out, particularly during her workdays. She does not cook with salt and consumes one to two cups of coffee daily. She drinks sodas or tea when eating out but does not purchase them for home consumption. She does not use supplements or herbs and has no smoking history.  She  experiences occasional headaches and knee pain, for which she takes ibuprofen  as needed. She reports snoring and her mother has observed episodes of possible apnea during sleep. She feels tired upon waking but does not typically fall asleep during the day.  She is adopted and is unaware of her biological family's medical history, except for her maternal grandmother having had breast cancer.  Previous antihypertensives:   Past Medical History:  Diagnosis Date   Essential (primary) hypertension    Hx of acute pyelonephritis    Morbid obesity (HCC) 06/21/2024   Prediabetes 06/21/2024   Primary hypertension 06/21/2024   UTI (lower urinary tract infection)     Past Surgical History:  Procedure Laterality Date   NO PAST SURGERIES      Current Medications: Current Meds  Medication Sig   amLODipine (NORVASC) 10 MG tablet Take 10 mg by mouth daily.     Allergies:   Latex   Social History   Socioeconomic History   Marital status: Single    Spouse name: Not on file   Number of children: Not on file   Years of education: Not on file   Highest education level: Not on file  Occupational History   Not on file  Tobacco Use   Smoking status: Never    Passive exposure: Past   Smokeless tobacco: Never  Vaping Use   Vaping status: Never Used  Substance and Sexual Activity   Alcohol use: Not Currently    Comment: Socially.   Drug use: No   Sexual activity: Yes  Birth control/protection: Condom  Other Topics Concern   Not on file  Social History Narrative   Not on file   Social Drivers of Health   Financial Resource Strain: Medium Risk (06/21/2024)   Overall Financial Resource Strain (CARDIA)    Difficulty of Paying Living Expenses: Somewhat hard  Food Insecurity: No Food Insecurity (06/21/2024)   Hunger Vital Sign    Worried About Running Out of Food in the Last Year: Never true    Ran Out of Food in the Last Year: Never true  Transportation Needs: No Transportation  Needs (06/21/2024)   PRAPARE - Administrator, Civil Service (Medical): No    Lack of Transportation (Non-Medical): No  Physical Activity: Inactive (06/21/2024)   Exercise Vital Sign    Days of Exercise per Week: 0 days    Minutes of Exercise per Session: 0 min  Stress: Stress Concern Present (06/21/2024)   [employer] of Occupational Health - Occupational Stress Questionnaire    Feeling of Stress: To some extent  Social Connections: Moderately Isolated (06/21/2024)   Social Connection and Isolation Panel    Frequency of Communication with Friends and Family: More than three times a week    Frequency of Social Gatherings with Friends and Family: Once a week    Attends Religious Services: More than 4 times per year    Active Member of [employer] or Organizations: No    Attends Banker Meetings: Never    Marital Status: Never married     Family History: The patient's family history includes Breast cancer in her maternal grandmother. There is no history of Colon cancer or Ovarian cancer. She was adopted.  ROS:   Please see the history of present illness.     All other systems reviewed and are negative.  EKGs/Labs/Other Studies Reviewed:    EKG:  EKG is ordered today.   EKG Interpretation Date/Time:    Ventricular Rate:    PR Interval:    QRS Duration:    QT Interval:    QTC Calculation:   R Axis:      Text Interpretation:          Recent Labs: No results found for requested labs within last 365 days.   Recent Lipid Panel No results found for: CHOL, TRIG, HDL, CHOLHDL, VLDL, LDLCALC, LDLDIRECT  Physical Exam:   VS:  BP (!) 140/92 (BP Location: Left Arm, Patient Position: Sitting, Cuff Size: Normal) Comment (BP Location): left forearm  Pulse 67   Ht 5' 5 (1.651 m)   Wt 284 lb 6.4 oz (129 kg)   SpO2 97%   BMI 47.33 kg/m  , BMI Body mass index is 47.33 kg/m. GENERAL:  Well appearing HEENT: Pupils equal round and reactive,  fundi not visualized, oral mucosa unremarkable NECK:  No jugular venous distention, waveform within normal limits, carotid upstroke brisk and symmetric, no bruits, no thyromegaly LUNGS:  Clear to auscultation bilaterally HEART:  RRR.  PMI not displaced or sustained,S1 and S2 within normal limits, no S3, no S4, no clicks, no rubs, no murmurs ABD:  Flat, positive bowel sounds normal in frequency in pitch, no bruits, no rebound, no guarding, no midline pulsatile mass, no hepatomegaly, no splenomegaly EXT:  2 plus pulses throughout, no edema, no cyanosis no clubbing SKIN:  No rashes no nodules NEURO:  Cranial nerves II through XII grossly intact, motor grossly intact throughout PSYCH:  Cognitively intact, oriented to person place and time   ASSESSMENT/PLAN:  Assessment & Plan # Primary hypertension Hypertension controlled with amlodipine; non-adherence due to age-related reluctance.  - Encouraged consistent amlodipine use.  BP is controlled when she takes it.  - Discussed lifestyle modifications to reduce medication need. - Referred to [facility] program for metabolic profiling and personalized plan. - Educated on ibuprofen 's impact on blood pressure; recommended acetaminophen .  # Morbid obesity Obesity exacerbates hypertension. Emphasized processed carbohydrates' role in weight gain and benefits of higher protein diet. - Referred to [facility] program for metabolic profiling and personalized plan. - Encouraged regular physical activity, including family activities or accountability groups.  # Suspected sleep apnea Snoring and observed apneas suggest sleep apnea, contributing to hypertension and fatigue. - Ordered home sleep study.  # Prediabetes A1c at top of prediabetes range. Discussed lifestyle changes to prevent diabetes progression. - Referred to [facility] program for dietary and exercise guidance.  # Hyperlipidemia Elevated LDL  cholesterol manageable with lifestyle changes. - Referred to [facility] program for dietary guidance.      Screening for Secondary Hypertension:     06/21/2024    8:55 AM  Causes  Drugs/Herbals Screened     - Comments limits salt.  2 coffees daily.  Occasional ibuprofen   Renovascular HTN N/A  Sleep Apnea Screened     - Comments snores, apnea.  no daytime somnolence  Hyperaldosteronism N/A  Pheochromocytoma N/A  Cushing's Syndrome N/A  Hyperparathyroidism Screened  Coarctation of the Aorta Screened  Compliance Screened     - Comments struggles    Relevant Labs/Studies:    Latest Ref Rng & Units 03/22/2009    9:23 PM 12/08/2008    5:11 AM  Basic Labs  Sodium 135 - 145 mEq/L 131  137   Potassium 3.5 - 5.1 mEq/L 3.4  4.0   Creatinine 0.4 - 1.2 mg/dL 9.22  0.9       Disposition:    FU with MD/PharmD in 6 months    Medication Adjustments/Labs and Tests Ordered: Current medicines are reviewed at length with the patient today.  Concerns regarding medicines are outlined above.  Orders Placed This Encounter  Procedures   Amb Ref to Medical Weight Management   EKG 12-Lead   [facility] Sleep Study   No orders of the defined types were placed in this encounter.    Signed, Annabella Scarce, MD  06/21/2024 10:38 AM    [facility]  "

## 2024-06-21 ENCOUNTER — Encounter (HOSPITAL_BASED_OUTPATIENT_CLINIC_OR_DEPARTMENT_OTHER): Payer: Self-pay | Admitting: Cardiovascular Disease

## 2024-06-21 ENCOUNTER — Ambulatory Visit (INDEPENDENT_AMBULATORY_CARE_PROVIDER_SITE_OTHER): Admitting: Cardiovascular Disease

## 2024-06-21 ENCOUNTER — Encounter (HOSPITAL_BASED_OUTPATIENT_CLINIC_OR_DEPARTMENT_OTHER): Payer: Self-pay

## 2024-06-21 ENCOUNTER — Encounter (INDEPENDENT_AMBULATORY_CARE_PROVIDER_SITE_OTHER): Payer: Self-pay

## 2024-06-21 VITALS — BP 140/92 | HR 67 | Ht 65.0 in | Wt 284.4 lb

## 2024-06-21 DIAGNOSIS — R0683 Snoring: Secondary | ICD-10-CM

## 2024-06-21 DIAGNOSIS — E66813 Obesity, class 3: Secondary | ICD-10-CM | POA: Diagnosis not present

## 2024-06-21 DIAGNOSIS — I10 Essential (primary) hypertension: Secondary | ICD-10-CM | POA: Insufficient documentation

## 2024-06-21 DIAGNOSIS — R400 Somnolence: Secondary | ICD-10-CM

## 2024-06-21 DIAGNOSIS — R7303 Prediabetes: Secondary | ICD-10-CM | POA: Insufficient documentation

## 2024-06-21 NOTE — Patient Instructions (Addendum)
 Medication Instructions:  Your physician recommends that you continue on your current medications as directed. Please refer to the Current Medication list given to you today.   Labwork: NONE  Testing/Procedures: [facility] HOME SLEEP STUDY   Follow-Up: 4 MONTHS WITH DR [place] OR CAITLIN W NP   You have been referred to  Amb Ref to Medical Weight Management (Caren Verdon) Where: [facility] Address: [address] [place] [address] Phone: [phone]  IF YOU DO NOT HEAR IN 2 WEEKS YOU CAN CALL THEM DIRECTLY AT THE NUMBER ABOVE  WatchPAT?  Is a FDA cleared portable home sleep study test that uses a watch and 3 points of contact to monitor 7 different channels, including your heart rate, oxygen saturations, body position, snoring, and chest motion.  The study is easy to use from the comfort of your own home and accurately detect sleep apnea.  Before bed, you attach the chest sensor, attached the sleep apnea bracelet to your nondominant hand, and attach the finger probe.  After the study, the raw data is downloaded from the watch and scored for apnea events.   For more information: [email or web address]  Patient Testing Instructions:  Do not put battery into the device until bedtime when you are ready to begin the test. Please call the support number if you need assistance after following the instructions below: 24 hour support line- [phone] or [facility] support at [phone] (option 2)  Download the [facility] WatchPAT One app through the google play store or App Store  Be sure to turn on or enable access to bluetooth in settlings on your smartphone/ device  Make sure no other bluetooth devices are on and within the vicinity of your smartphone/ device and WatchPAT watch during testing.  Make sure to leave your smart phone/ device plugged in and charging all night.  When ready for bed:  Follow the instructions step  by step in the WatchPAT One App to activate the testing device. For additional instructions, including video instruction, visit the WatchPAT One video on Youtube. You can search for WatchPat One within Youtube (video is 4 minutes and 18 seconds) or enter: https://youtube/watch?[email or web address]_vbiwxE Please note: You will be prompted to enter a Pin to connect via bluetooth when starting the test. The PIN will be assigned to you when you receive the test.  The device is disposable, but it recommended that you retain the device until you receive a call letting you know the study has been received and the results have been interpreted.  We will let you know if the study did not transmit to us  properly after the test is completed. You do not need to call us  to confirm the receipt of the test.  Please complete the test within 48 hours of receiving PIN.   Frequently Asked Questions:  What is Watch Bruna one?  A single use fully disposable home sleep apnea testing device and will not need to be returned after completion.  What are the requirements to use WatchPAT one?  The be able to have a successful watchpat one sleep study, you should have your Watch pat one device, your smart phone, watch pat one app, your PIN number and Internet access What type of phone do I need?  You should have a smart phone that uses Android 5.1 and above or any Iphone with IOS 10 and above How can I download the WatchPAT one app?  Based on your device type search for WatchPAT one  app either in google play for android devices or APP store for Iphone's Where will I get my PIN for the study?  Your PIN will be provided by your physician's office. It is used for authentication and if you lose/forget your PIN, please reach out to your providers office.  I do not have Internet at home. Can I do WatchPAT one study?  WatchPAT One needs Internet connection throughout the night to be able to transmit the sleep data. You can use your home/local  internet or your cellular's data package. However, it is always recommended to use home/local Internet. It is estimated that between 20MB-30MB will be used with each study.However, the application will be looking for space in the phone to start the study.  What happens if I lose internet or bluetooth connection?  During the internet disconnection, your phone will not be able to transmit the sleep data. All the data, will be stored in your phone. As soon as the internet connection is back on, the phone will being sending the sleep data. During the bluetooth disconnection, WatchPAT one will not be able to to send the sleep data to your phone. Data will be kept in the WatchPAT one until two devices have bluetooth connection back on. As soon as the connection is back on, WatchPAT one will send the sleep data to the phone.  How long do I need to wear the WatchPAT one?  After you start the study, you should wear the device at least 6 hours.  How far should I keep my phone from the device?  During the night, your phone should be within 15 feet.  What happens if I leave the room for restroom or other reasons?  Leaving the room for any reason will not cause any problem. As soon as your get back to the room, both devices will reconnect and will continue to send the sleep data. Can I use my phone during the sleep study?  Yes, you can use your phone as usual during the study. But it is recommended to put your watchpat one on when you are ready to go to bed.  How will I get my study results?  A soon as you completed your study, your sleep data will be sent to the provider. They will then share the results with you when they are ready.

## 2024-07-11 ENCOUNTER — Ambulatory Visit (INDEPENDENT_AMBULATORY_CARE_PROVIDER_SITE_OTHER): Admitting: Bariatrics

## 2024-07-11 ENCOUNTER — Encounter: Payer: Self-pay | Admitting: Bariatrics

## 2024-07-11 VITALS — BP 145/85 | HR 74 | Ht 66.5 in | Wt 277.0 lb

## 2024-07-11 DIAGNOSIS — Z6841 Body Mass Index (BMI) 40.0 and over, adult: Secondary | ICD-10-CM | POA: Diagnosis not present

## 2024-07-11 DIAGNOSIS — R7303 Prediabetes: Secondary | ICD-10-CM

## 2024-07-11 NOTE — Progress Notes (Signed)
 Office: [phone]  /  Fax: [phone]   Initial Visit  Jaime Munoz was seen in clinic today to evaluate for obesity. She is interested in losing weight to improve overall health and reduce the risk of weight related complications. She presents today to review program treatment options, initial physical assessment, and evaluation.     She was referred by: Specialist ([facility])  When asked what else they would like to accomplish? She states: Adopt a healthier eating pattern and lifestyle, Improve energy levels and physical activity, Improve existing medical conditions, and Improve quality of life  When asked how has your weight affected you? She states: Contributed to medical problems, Having fatigue, and Having poor endurance  Some associated conditions: Hypertension and Prediabetes  Contributing factors: disruption of circadian rhythm / sleep disordered breathing, chronic skipping of meals, and sedentary job  Weight promoting medications identified: Other: Nexplanon  Current nutrition plan: Portion control / smart choices and Other: Keto, Weight Watchers  Current level of physical activity: U-tube-HIIT  Current or previous pharmacotherapy: Metformin  Response to medication: Ineffective so it was discontinued   Past medical history includes:   Past Medical History:  Diagnosis Date   Essential (primary) hypertension    Hx of acute pyelonephritis    Morbid obesity (HCC) 06/21/2024   Prediabetes 06/21/2024   Primary hypertension 06/21/2024   UTI (lower urinary tract infection)      Objective:   BP (!) 145/85   Pulse 74   Ht 5' 6.5 (1.689 m)   Wt 277 lb (125.6 kg)   SpO2 100%   BMI 44.04 kg/m  She was weighed on the bioimpedance scale: Body mass index is 44.04 kg/m.  Peak Weight: 290 , Body Fat%:44.1 %, Visceral Fat Rating:12, Weight trend over the last 12 months: fluctuating   General:  Alert, oriented and cooperative. Patient is in no acute distress.   Respiratory: Normal respiratory effort, no problems with respiration noted  Extremities: Normal range of motion.    Mental Status: Normal mood and affect. Normal behavior. Normal judgment and thought content.   DIAGNOSTIC DATA REVIEWED:  BMET    Component Value Date/Time   NA 131 (L) 03/22/2009 2123   K 3.4 (L) 03/22/2009 2123   CL 100 03/22/2009 2123   CO2 21 03/22/2009 2123   GLUCOSE 85 03/22/2009 2123   BUN <1 REPEATED TO VERIFY (L) 03/22/2009 2123   CREATININE 0.77 03/22/2009 2123   CALCIUM 8.7 03/22/2009 2123   GFRNONAA >60 03/22/2009 2123   GFRAA  03/22/2009 2123    >60        The eGFR has been calculated using the MDRD equation. This calculation has not been validated in all clinical situations. eGFR's persistently <60 mL/min signify possible Chronic Kidney Disease.   No results found for: HGBA1C No results found for: INSULIN CBC    Component Value Date/Time   WBC 19.8 (H) 07/18/2018 0553   RBC 4.02 07/18/2018 0553   HGB 10.0 (L) 07/18/2018 0553   HCT 31.6 (L) 07/18/2018 0553   PLT 140 (L) 07/18/2018 0553   MCV 78.6 (L) 07/18/2018 0553   MCH 24.9 (L) 07/18/2018 0553   MCHC 31.6 07/18/2018 0553   RDW 16.7 (H) 07/18/2018 0553   Iron/TIBC/Ferritin/ %Sat No results found for: IRON, TIBC, FERRITIN, IRONPCTSAT Lipid Panel  No results found for: CHOL, TRIG, HDL, CHOLHDL, VLDL, LDLCALC, LDLDIRECT Hepatic Function Panel     Component Value Date/Time   PROT 6.5 03/22/2009 2123   ALBUMIN 2.5 (L)  03/22/2009 2123   AST 18 03/22/2009 2123   ALT 15 03/22/2009 2123   ALKPHOS 91 03/22/2009 2123   BILITOT 0.8 03/22/2009 2123   No results found for: TSH   Assessment and Plan:   Prediabetes Last A1c was 6.3 ([facility] 12/24)   Medication(s): none  Plan: Will begin the plan in the near future. Will minimize all refined carbohydrates both sweets and starches.    Morbid Obesity: Current BMI 44.04    Obesity Treatment / Action  Plan:  Patient will work on garnering support from family and friends to begin weight loss journey. Will work on eliminating or reducing the presence of highly palatable, calorie dense foods in the home. Will complete provided nutritional and psychosocial assessment questionnaire before the next appointment. Will be scheduled for indirect calorimetry to determine resting energy expenditure in a fasting state.  This will allow us  to create a reduced calorie, high-protein meal plan to promote loss of fat mass while preserving muscle mass. Counseled on the health benefits of losing 5%-15% of total body weight. Was counseled on nutritional approaches to weight loss and benefits of reducing processed foods and consuming plant-based foods and high quality protein as part of nutritional weight management. Was counseled on pharmacotherapy and role as an adjunct in weight management.   Obesity Education Performed Today:  She was weighed on the bioimpedance scale and results were discussed and documented in the synopsis.  We discussed obesity as a disease and the importance of a more detailed evaluation of all the factors contributing to the disease.  We discussed the importance of long term lifestyle changes which include nutrition, exercise and behavioral modifications as well as the importance of customizing this to her specific health and social needs.  We discussed the benefits of reaching a healthier weight to alleviate the symptoms of existing conditions and reduce the risks of the biomechanical, metabolic and psychological effects of obesity.  Discussed New Patient/Late Arrival, and Cancellation Policies. Patient voiced understanding and allowed to ask questions.   Jaime Munoz appears to be in the action stage of change and states they are ready to start intensive lifestyle modifications and behavioral modifications.  30 minutes was spent today on this visit including the above counseling,  pre-visit chart review, and post-visit documentation.  Reviewed by clinician on day of visit: allergies, medications, problem list, medical history, surgical history, family history, social history, and previous encounter notes.    Michelangelo Rindfleisch A. Jaime Munoz.

## 2024-07-16 ENCOUNTER — Encounter (HOSPITAL_BASED_OUTPATIENT_CLINIC_OR_DEPARTMENT_OTHER): Payer: Self-pay | Admitting: Cardiology

## 2024-07-16 DIAGNOSIS — R0683 Snoring: Secondary | ICD-10-CM | POA: Diagnosis not present

## 2024-07-17 ENCOUNTER — Ambulatory Visit: Attending: Cardiovascular Disease

## 2024-07-17 DIAGNOSIS — R400 Somnolence: Secondary | ICD-10-CM

## 2024-07-17 DIAGNOSIS — I10 Essential (primary) hypertension: Secondary | ICD-10-CM

## 2024-07-17 DIAGNOSIS — R0683 Snoring: Secondary | ICD-10-CM

## 2024-07-17 NOTE — Procedures (Signed)
    SLEEP STUDY REPORT Patient Information Study Date: 07/16/2024 Patient Name: Jaime Munoz Patient ID: [id number] Birth Date: Nov 01, 1990 Age: 34 Gender: Female BMI: 47.4 (W=284 lb, H=5' 5'') Referring Physician: Annabella Scarce, MD  TEST DESCRIPTION: Home sleep apnea testing was completed using the WatchPat, a Type 1 device, utilizing peripheral arterial tonometry (PAT), chest movement, actigraphy, pulse oximetry, pulse rate, body position and snore. AHI was calculated with apnea and hypopnea using valid sleep time as the denominator. RDI includes apneas, hypopneas, and RERAs. The data acquired and the scoring of sleep and all associated events were performed in accordance with the recommended standards and specifications as outlined in the AASM Manual for the Scoring of Sleep and Associated Events 2.2.0 (2015).  FINDINGS: 1. No evidence of Obstructive Sleep Apnea with AHI 2.1/hr. 2. No Central Sleep Apnea. 3. Oxygen desaturations as low as 92%. 4. Moderate to severe snoring was present. O2 sats were < 88% for 0 minutes. 5. Total sleep time was 6 hrs and 4 min. 6. 18.7% of total sleep time was spent in REM sleep. 7. Normal sleep onset latency at 16 min. 8. Shortened REM sleep onset latency at 52 min. 9. Total awakenings were 13.  DIAGNOSIS: Normal study with no significant sleep disordered breathing.  RECOMMENDATIONS: 1. Normal study with no significant sleep disordered breathing. 2. Healthy sleep recommendations include: adequate nightly sleep (normal 7-9 hrs/night), avoidance of caffeine after noon and alcohol near bedtime, and maintaining a sleep environment that is cool, dark and quiet. 3. Weight loss for overweight patients is recommended. 4. Snoring recommendations include: weight loss where appropriate, side sleeping, and avoidance of alcohol before bed. 5. Operation of motor vehicle or dangerous equipment must be avoided when feeling drowsy, excessively sleepy,  or mentally fatigued. 6. An ENT consultation which may be useful for specific causes of and possible treatment of bothersome snoring . 7. Weight loss may be of benefit in reducing the severity of snoring.   Signature: Wilbert Bihari, MD; [facility]; Diplomat, American Board of Sleep Medicine Electronically Signed: 07/17/2024 8:22:22 PM

## 2024-07-24 ENCOUNTER — Telehealth: Payer: Self-pay | Admitting: *Deleted

## 2024-07-24 NOTE — Telephone Encounter (Signed)
-----   Message from Jaime Munoz sent at 07/17/2024  8:23 PM EST ----- Please let patient know that sleep study showed no significant sleep apnea.

## 2024-08-07 ENCOUNTER — Ambulatory Visit: Admitting: Bariatrics

## 2024-08-21 ENCOUNTER — Ambulatory Visit: Admitting: Bariatrics

## 2024-09-04 ENCOUNTER — Ambulatory Visit: Admitting: Bariatrics

## 2024-09-18 ENCOUNTER — Ambulatory Visit: Admitting: Bariatrics

## 2024-09-24 ENCOUNTER — Ambulatory Visit: Payer: Self-pay | Admitting: Cardiovascular Disease

## 2024-10-26 ENCOUNTER — Encounter (HOSPITAL_BASED_OUTPATIENT_CLINIC_OR_DEPARTMENT_OTHER): Admitting: Cardiovascular Disease
